# Patient Record
Sex: Female | Born: 1935 | Race: Black or African American | Hispanic: No | Smoking: Never smoker
Health system: Southern US, Community
[De-identification: ages and names within clinical notes are randomized; demographics above are authoritative.]

## PROBLEM LIST (undated history)

## (undated) DIAGNOSIS — D649 Anemia, unspecified: Secondary | ICD-10-CM

## (undated) DIAGNOSIS — E785 Hyperlipidemia, unspecified: Secondary | ICD-10-CM

## (undated) DIAGNOSIS — I10 Essential (primary) hypertension: Secondary | ICD-10-CM

## (undated) DIAGNOSIS — R0600 Dyspnea, unspecified: Secondary | ICD-10-CM

## (undated) DIAGNOSIS — N183 Chronic kidney disease, stage 3 unspecified: Secondary | ICD-10-CM

## (undated) DIAGNOSIS — M109 Gout, unspecified: Secondary | ICD-10-CM

## (undated) DIAGNOSIS — R7303 Prediabetes: Secondary | ICD-10-CM

## (undated) DIAGNOSIS — E119 Type 2 diabetes mellitus without complications: Secondary | ICD-10-CM

## (undated) DIAGNOSIS — I639 Cerebral infarction, unspecified: Secondary | ICD-10-CM

## (undated) DIAGNOSIS — M1990 Unspecified osteoarthritis, unspecified site: Secondary | ICD-10-CM

## (undated) HISTORY — PX: ABDOMINAL HYSTERECTOMY: SHX81

## (undated) HISTORY — DX: Gout, unspecified: M10.9

## (undated) HISTORY — PX: JOINT REPLACEMENT: SHX530

## (undated) HISTORY — DX: Type 2 diabetes mellitus without complications: E11.9

---

## 2005-02-21 ENCOUNTER — Ambulatory Visit: Payer: Self-pay | Admitting: Unknown Physician Specialty

## 2005-04-18 ENCOUNTER — Ambulatory Visit: Payer: Self-pay | Admitting: Gastroenterology

## 2006-07-09 ENCOUNTER — Ambulatory Visit: Payer: Self-pay | Admitting: Unknown Physician Specialty

## 2006-08-02 ENCOUNTER — Emergency Department: Payer: Self-pay

## 2006-08-02 ENCOUNTER — Other Ambulatory Visit: Payer: Self-pay

## 2006-11-28 ENCOUNTER — Emergency Department: Payer: Self-pay | Admitting: Unknown Physician Specialty

## 2006-11-28 ENCOUNTER — Other Ambulatory Visit: Payer: Self-pay

## 2006-12-15 ENCOUNTER — Ambulatory Visit: Payer: Self-pay | Admitting: Unknown Physician Specialty

## 2007-07-20 ENCOUNTER — Ambulatory Visit: Payer: Self-pay | Admitting: Unknown Physician Specialty

## 2008-07-21 ENCOUNTER — Ambulatory Visit: Payer: Self-pay | Admitting: Unknown Physician Specialty

## 2008-09-09 ENCOUNTER — Encounter: Payer: Self-pay | Admitting: Internal Medicine

## 2008-09-19 ENCOUNTER — Encounter: Payer: Self-pay | Admitting: Internal Medicine

## 2009-07-26 ENCOUNTER — Ambulatory Visit: Payer: Self-pay | Admitting: Unknown Physician Specialty

## 2009-09-01 ENCOUNTER — Ambulatory Visit: Payer: Self-pay | Admitting: Gastroenterology

## 2009-10-13 ENCOUNTER — Ambulatory Visit: Payer: Self-pay | Admitting: Unknown Physician Specialty

## 2009-10-28 ENCOUNTER — Emergency Department: Payer: Self-pay | Admitting: Unknown Physician Specialty

## 2010-10-04 ENCOUNTER — Ambulatory Visit: Payer: Self-pay | Admitting: Unknown Physician Specialty

## 2011-07-02 ENCOUNTER — Ambulatory Visit: Payer: Self-pay | Admitting: Unknown Physician Specialty

## 2011-08-22 ENCOUNTER — Ambulatory Visit: Payer: Self-pay | Admitting: Gastroenterology

## 2011-12-17 ENCOUNTER — Ambulatory Visit: Payer: Self-pay | Admitting: Physician Assistant

## 2012-06-28 ENCOUNTER — Emergency Department: Payer: Self-pay | Admitting: Emergency Medicine

## 2012-06-28 LAB — CBC
HCT: 38.7 %
HGB: 13.2 g/dL
MCH: 29.8 pg
MCHC: 34.1 g/dL
MCV: 87 fL
Platelet: 280 x10 3/mm 3
RBC: 4.43 X10 6/mm 3
RDW: 13.1 %
WBC: 7.7 x10 3/mm 3

## 2012-06-28 LAB — BASIC METABOLIC PANEL WITH GFR
Anion Gap: 6 — ABNORMAL LOW
BUN: 14 mg/dL
Calcium, Total: 9 mg/dL
Chloride: 107 mmol/L
Co2: 24 mmol/L
Creatinine: 1.12 mg/dL
EGFR (African American): 55 — ABNORMAL LOW
EGFR (Non-African Amer.): 47 — ABNORMAL LOW
Glucose: 101 mg/dL — ABNORMAL HIGH
Osmolality: 274
Potassium: 4.5 mmol/L
Sodium: 137 mmol/L

## 2012-06-28 LAB — CK TOTAL AND CKMB (NOT AT ARMC)
CK, Total: 171 U/L
CK-MB: 0.9 ng/mL

## 2012-06-28 LAB — TROPONIN I: Troponin-I: <0.02 ng/mL

## 2012-07-28 ENCOUNTER — Ambulatory Visit: Payer: Self-pay | Admitting: Internal Medicine

## 2013-05-06 ENCOUNTER — Ambulatory Visit: Payer: Self-pay | Admitting: Physician Assistant

## 2014-04-14 DIAGNOSIS — I10 Essential (primary) hypertension: Secondary | ICD-10-CM | POA: Insufficient documentation

## 2014-04-14 DIAGNOSIS — D649 Anemia, unspecified: Secondary | ICD-10-CM | POA: Insufficient documentation

## 2014-04-14 DIAGNOSIS — E785 Hyperlipidemia, unspecified: Secondary | ICD-10-CM | POA: Insufficient documentation

## 2014-04-26 ENCOUNTER — Ambulatory Visit: Payer: Self-pay | Admitting: Physician Assistant

## 2014-05-12 ENCOUNTER — Ambulatory Visit: Payer: Self-pay | Admitting: Physician Assistant

## 2015-01-27 DIAGNOSIS — Z96643 Presence of artificial hip joint, bilateral: Secondary | ICD-10-CM | POA: Insufficient documentation

## 2015-01-27 DIAGNOSIS — M5136 Other intervertebral disc degeneration, lumbar region: Secondary | ICD-10-CM | POA: Insufficient documentation

## 2015-01-31 DIAGNOSIS — Z86010 Personal history of colonic polyps: Secondary | ICD-10-CM | POA: Insufficient documentation

## 2015-04-12 ENCOUNTER — Other Ambulatory Visit: Payer: Self-pay | Admitting: Physician Assistant

## 2015-04-12 DIAGNOSIS — Z1231 Encounter for screening mammogram for malignant neoplasm of breast: Secondary | ICD-10-CM

## 2015-05-01 ENCOUNTER — Encounter: Payer: Self-pay | Admitting: *Deleted

## 2015-05-02 ENCOUNTER — Ambulatory Visit
Admission: RE | Admit: 2015-05-02 | Discharge: 2015-05-02 | Disposition: A | Discharge status: Home / Self Care | Payer: Medicare Other | Source: Ambulatory Visit | Attending: Gastroenterology | Admitting: Gastroenterology

## 2015-05-02 ENCOUNTER — Encounter
Admission: RE | Disposition: A | Discharge status: Home / Self Care | Payer: Self-pay | Source: Ambulatory Visit | Attending: Gastroenterology

## 2015-05-02 ENCOUNTER — Encounter: Payer: Self-pay | Admitting: *Deleted

## 2015-05-02 ENCOUNTER — Ambulatory Visit: Payer: Medicare Other | Admitting: Anesthesiology

## 2015-05-02 DIAGNOSIS — E785 Hyperlipidemia, unspecified: Secondary | ICD-10-CM | POA: Diagnosis not present

## 2015-05-02 DIAGNOSIS — D120 Benign neoplasm of cecum: Secondary | ICD-10-CM | POA: Insufficient documentation

## 2015-05-02 DIAGNOSIS — M1990 Unspecified osteoarthritis, unspecified site: Secondary | ICD-10-CM | POA: Insufficient documentation

## 2015-05-02 DIAGNOSIS — Z79899 Other long term (current) drug therapy: Secondary | ICD-10-CM | POA: Diagnosis not present

## 2015-05-02 DIAGNOSIS — I10 Essential (primary) hypertension: Secondary | ICD-10-CM | POA: Insufficient documentation

## 2015-05-02 DIAGNOSIS — D649 Anemia, unspecified: Secondary | ICD-10-CM | POA: Diagnosis not present

## 2015-05-02 DIAGNOSIS — Z7982 Long term (current) use of aspirin: Secondary | ICD-10-CM | POA: Insufficient documentation

## 2015-05-02 DIAGNOSIS — Z86010 Personal history of colonic polyps: Secondary | ICD-10-CM | POA: Diagnosis present

## 2015-05-02 DIAGNOSIS — D125 Benign neoplasm of sigmoid colon: Secondary | ICD-10-CM | POA: Diagnosis not present

## 2015-05-02 DIAGNOSIS — K5730 Diverticulosis of large intestine without perforation or abscess without bleeding: Secondary | ICD-10-CM | POA: Diagnosis not present

## 2015-05-02 HISTORY — DX: Anemia, unspecified: D64.9

## 2015-05-02 HISTORY — DX: Essential (primary) hypertension: I10

## 2015-05-02 HISTORY — DX: Unspecified osteoarthritis, unspecified site: M19.90

## 2015-05-02 HISTORY — PX: COLONOSCOPY WITH PROPOFOL: SHX5780

## 2015-05-02 HISTORY — DX: Hyperlipidemia, unspecified: E78.5

## 2015-05-02 SURGERY — COLONOSCOPY WITH PROPOFOL
Anesthesia: General

## 2015-05-02 MED ORDER — MIDAZOLAM HCL 5 MG/5ML IJ SOLN
INTRAMUSCULAR | Status: DC | PRN
  Administered 2015-05-02: 1 mg via INTRAVENOUS

## 2015-05-02 MED ORDER — LIDOCAINE HCL (CARDIAC) 20 MG/ML IV SOLN
INTRAVENOUS | Status: DC | PRN
  Administered 2015-05-02: 20 mg via INTRAVENOUS

## 2015-05-02 MED ORDER — PROPOFOL 10 MG/ML IV BOLUS
INTRAVENOUS | Status: DC | PRN
  Administered 2015-05-02: 50 mg via INTRAVENOUS

## 2015-05-02 MED ORDER — SODIUM CHLORIDE 0.9 % IV SOLN: INTRAVENOUS | Status: DC

## 2015-05-02 MED ORDER — FENTANYL CITRATE (PF) 100 MCG/2ML IJ SOLN
INTRAMUSCULAR | Status: DC | PRN
Start: 2015-05-02 — End: 2015-05-02
  Administered 2015-05-02: 50 ug via INTRAVENOUS

## 2015-05-02 MED ORDER — EPHEDRINE SULFATE 50 MG/ML IJ SOLN
INTRAMUSCULAR | Status: DC | PRN
  Administered 2015-05-02: 10 mg via INTRAVENOUS

## 2015-05-02 MED ORDER — PROPOFOL 500 MG/50ML IV EMUL
INTRAVENOUS | Status: DC | PRN
  Administered 2015-05-02: 100 ug/kg/min via INTRAVENOUS

## 2015-05-02 MED ORDER — SODIUM CHLORIDE 0.9 % IV SOLN
INTRAVENOUS | Status: DC
  Administered 2015-05-02 (×2): via INTRAVENOUS

## 2015-05-02 NOTE — Op Note (Signed)
[facility] Gastroenterology Patient Name: Betty Atkins Procedure Date: 05/02/2015 8:13 AM MRN: [id number] Account #: [id number] Date of Birth: 1933-01-03 Admit Type: Outpatient Age: 81 Room: [facility] ENDO ROOM 3 Gender: Female Note Status: Finalized Procedure:         Colonoscopy Indications:       Personal history of colonic polyps Providers:         Lollie Sails, MD Referring MD:      Precious Bard, MD (Referring MD) Medicines:         Monitored Anesthesia Care Complications:     No immediate complications. Procedure:         Pre-Anesthesia Assessment:                    - ASA Grade Assessment: III - A patient with severe                     systemic disease.                    After obtaining informed consent, the colonoscope was                     passed under direct vision. Throughout the procedure, the                     patient's blood pressure, pulse, and oxygen saturations                     were monitored continuously. The Colonoscope was                     introduced through the anus and advanced to the the cecum,                     identified by appendiceal orifice and ileocecal valve. The                     colonoscopy was performed without difficulty. The patient                     tolerated the procedure well. The quality of the bowel                     preparation was good. Findings:      A 2 mm polyp was found at the appendiceal orifice. The polyp was       sessile. The polyp was removed with a cold biopsy forceps. Resection and       retrieval were complete.      A 3 mm polyp was found in the proximal sigmoid colon. The polyp was       flat. The polyp was removed with a cold snare. Resection and retrieval       were complete.      A few small-mouthed diverticula were found in the sigmoid colon.      The retroflexed view of the distal rectum and anal verge was normal and       showed no anal or rectal abnormalities.  The exam was otherwise without abnormality.      The digital rectal exam was normal. Impression:        - One 2 mm polyp at the appendiceal orifice. Resected and  retrieved.                    - One 3 mm polyp in the proximal sigmoid colon. Resected                     and retrieved.                    - Diverticulosis in the sigmoid colon.                    - The distal rectum and anal verge are normal on                     retroflexion view.                    - The examination was otherwise normal. Recommendation:    - Await pathology results.                    - Telephone GI clinic for pathology results in 1 week. Procedure Code(s): --- Professional ---                    [phone], Colonoscopy, flexible; with removal of tumor(s),                     polyp(s), or other lesion(s) by snare technique                    45380, 61, Colonoscopy, flexible; with biopsy, single or                     multiple Diagnosis Code(s): --- Professional ---                    211.3, Benign neoplasm of colon                    V12.72, Personal history of colonic polyps                    562.10, Diverticulosis of colon (without mention of                     hemorrhage) CPT copyright 2014 American Medical Association. All rights reserved. The codes documented in this report are preliminary and upon coder review may  be revised to meet current compliance requirements. Lollie Sails, MD 05/02/2015 8:42:48 AM This report has been signed electronically. Number of Addenda: 0 Note Initiated On: 05/02/2015 8:13 AM Scope Withdrawal Time: 0 hours 11 minutes 23 seconds  Total Procedure Duration: 0 hours 19 minutes 21 seconds       [facility]

## 2015-05-02 NOTE — Anesthesia Postprocedure Evaluation (Signed)
  Anesthesia Post-op Note  Patient: Betty Atkins  Procedure(s) Performed: Procedure(s): COLONOSCOPY WITH PROPOFOL (N/A)  Anesthesia type:General  Patient location: PACU  Post pain: Pain level controlled  Post assessment: Post-op Vital signs reviewed, Patient's Cardiovascular Status Stable, Respiratory Function Stable, Patent Airway and No signs of Nausea or vomiting  Post vital signs: Reviewed and stable  Last Vitals:  Filed Vitals:   05/02/15 0852  BP: 107/59  Pulse: 90  Temp: 36 C  Resp: 19    Level of consciousness: awake, alert  and patient cooperative  Complications: No apparent anesthesia complications

## 2015-05-02 NOTE — Anesthesia Preprocedure Evaluation (Signed)
Anesthesia Evaluation  Patient identified by MRN, date of birth, ID band Patient awake    Reviewed: Allergy & Precautions, H&P , NPO status , Patient's Chart, lab work & pertinent test results  History of Anesthesia Complications Negative for: history of anesthetic complications  Airway Mallampati: III  TM Distance: >3 FB Neck ROM: limited    Dental no notable dental hx. (+) Teeth Intact   Pulmonary neg pulmonary ROS, neg shortness of breath,    Pulmonary exam normal breath sounds clear to auscultation       Cardiovascular Exercise Tolerance: Good hypertension, (-) Past MI Normal cardiovascular exam Rhythm:regular Rate:Normal     Neuro/Psych negative neurological ROS  negative psych ROS   GI/Hepatic negative GI ROS, Neg liver ROS, neg GERD  ,  Endo/Other  negative endocrine ROS  Renal/GU negative Renal ROS  negative genitourinary   Musculoskeletal  (+) Arthritis ,   Abdominal   Peds  Hematology negative hematology ROS (+) anemia ,   Anesthesia Other Findings Past Medical History:   Hyperlipidemia                                               Hypertension                                                 Arthritis                                                      Comment:degenerative   Anemia                                                      BMI    Body Mass Index   36.01 kg/m 2    Signs and symptoms suggestive of sleep apnea    Reproductive/Obstetrics negative OB ROS                             Anesthesia Physical Anesthesia Plan  ASA: III  Anesthesia Plan: General   Post-op Pain Management:    Induction:   Airway Management Planned:   Additional Equipment:   Intra-op Plan:   Post-operative Plan:   Informed Consent: I have reviewed the patients History and Physical, chart, labs and discussed the procedure including the risks, benefits and alternatives for the  proposed anesthesia with the patient or authorized representative who has indicated his/her understanding and acceptance.   Dental Advisory Given  Plan Discussed with: Anesthesiologist, CRNA and Surgeon  Anesthesia Plan Comments:         Anesthesia Quick Evaluation

## 2015-05-02 NOTE — H&P (Signed)
Outpatient short stay form Pre-procedure 05/02/2015 8:05 AM Lollie Sails MD  Primary Physician: Jackelyn Poling NP  Reason for visit:  Colonoscopy  History of present illness:  Patient is a 81 year old female with a personal history of adenomatous colon polyps. She is presenting today for follow-up colonoscopy. She tolerated her prep well. She takes no anticoagulation medications or aspirin products.    Current facility-administered medications:  .  0.9 %  sodium chloride infusion, , Intravenous, Continuous, Lollie Sails, MD, Last Rate: 20 mL/hr at 05/02/15 0729 .  0.9 %  sodium chloride infusion, , Intravenous, Continuous, Lollie Sails, MD  Prescriptions prior to admission  Medication Sig Dispense Refill Last Dose  . amLODipine-benazepril (LOTREL) 5-20 MG capsule Take 1 capsule by mouth daily.     Marland Kitchen aspirin 81 MG tablet Take 81 mg by mouth daily.   Past Week at Unknown time  . atorvastatin (LIPITOR) 20 MG tablet Take 20 mg by mouth daily.     . Calcium Carbonate-Vit D-Min (CALTRATE PLUS PO) Take by mouth.     . cholecalciferol (VITAMIN D) 1000 UNITS tablet Take 1,000 Units by mouth daily.     . hydrochlorothiazide (HYDRODIURIL) 25 MG tablet Take 25 mg by mouth daily.     . Multiple Vitamin (MULTIVITAMIN) tablet Take 1 tablet by mouth daily.     . timolol (TIMOPTIC) 0.5 % ophthalmic solution 1 drop 2 (two) times daily.        No Known Allergies   Past Medical History  Diagnosis Date  . Hyperlipidemia   . Hypertension   . Arthritis     degenerative  . Anemia     Review of systems:      Physical Exam    Heart and lungs: Regular rate and rhythm without rub or gallop, lungs are bilaterally clear    HEENT: Norm cephalic atraumatic eyes are anicteric    Other:     Pertinant exam for procedure: Soft nontender nondistended bowel sounds positive normoactive    Planned proceedures: Colonoscopy and indicated procedures I have discussed the risks benefits  and complications of procedures to include not limited to bleeding, infection, perforation and the risk of sedation and the patient wishes to proceed.    Lollie Sails, MD Gastroenterology 05/02/2015  8:05 AM

## 2015-05-02 NOTE — Transfer of Care (Signed)
Immediate Anesthesia Transfer of Care Note  Patient: Emalea Mix  Procedure(s) Performed: Procedure(s): COLONOSCOPY WITH PROPOFOL (N/A)  Patient Location: PACU and Short Stay  Anesthesia Type:General  Level of Consciousness: awake and oriented  Airway & Oxygen Therapy: Patient Spontanous Breathing and Patient connected to nasal cannula oxygen  Post-op Assessment: Report given to RN and Post -op Vital signs reviewed and stable  Post vital signs: Reviewed and stable  Last Vitals:  Filed Vitals:   05/02/15 0715  BP: 166/79  Pulse: 98  Temp: 36.4 C  Resp: 18    Complications: No apparent anesthesia complications

## 2015-05-03 LAB — SURGICAL PATHOLOGY

## 2015-05-10 ENCOUNTER — Encounter: Payer: Self-pay | Admitting: Gastroenterology

## 2015-05-15 ENCOUNTER — Ambulatory Visit
Admission: RE | Admit: 2015-05-15 | Discharge: 2015-05-15 | Disposition: A | Discharge status: Home / Self Care | Payer: Medicare Other | Source: Ambulatory Visit | Attending: Physician Assistant | Admitting: Physician Assistant

## 2015-05-15 ENCOUNTER — Other Ambulatory Visit: Payer: Self-pay | Admitting: Physician Assistant

## 2015-05-15 DIAGNOSIS — Z1231 Encounter for screening mammogram for malignant neoplasm of breast: Secondary | ICD-10-CM

## 2015-10-13 ENCOUNTER — Other Ambulatory Visit: Payer: Self-pay | Admitting: Physician Assistant

## 2015-10-13 DIAGNOSIS — R1032 Left lower quadrant pain: Secondary | ICD-10-CM

## 2015-10-31 ENCOUNTER — Ambulatory Visit: Payer: Medicare Other

## 2015-11-30 ENCOUNTER — Ambulatory Visit: Payer: Medicare Other

## 2016-01-04 ENCOUNTER — Ambulatory Visit
Admission: RE | Admit: 2016-01-04 | Discharge: 2016-01-04 | Disposition: A | Discharge status: Home / Self Care | Payer: Medicare Other | Source: Ambulatory Visit | Attending: Physician Assistant | Admitting: Physician Assistant

## 2016-01-04 DIAGNOSIS — R1032 Left lower quadrant pain: Secondary | ICD-10-CM | POA: Diagnosis present

## 2016-01-04 DIAGNOSIS — M5136 Other intervertebral disc degeneration, lumbar region: Secondary | ICD-10-CM | POA: Insufficient documentation

## 2016-01-04 LAB — POCT I-STAT CREATININE: Creatinine, Ser: 1.2 mg/dL — ABNORMAL HIGH (ref 0.44–1.00)

## 2016-01-04 MED ORDER — IOPAMIDOL (ISOVUE-300) INJECTION 61%
100.0000 mL | Freq: Once | INTRAVENOUS | Status: AC | PRN
  Administered 2016-01-04: 100 mL via INTRAVENOUS

## 2016-05-23 ENCOUNTER — Other Ambulatory Visit: Payer: Self-pay | Admitting: Physician Assistant

## 2016-05-23 DIAGNOSIS — Z1231 Encounter for screening mammogram for malignant neoplasm of breast: Secondary | ICD-10-CM

## 2016-06-26 ENCOUNTER — Ambulatory Visit
Admission: RE | Admit: 2016-06-26 | Discharge: 2016-06-26 | Disposition: A | Discharge status: Home / Self Care | Payer: Medicare Other | Source: Ambulatory Visit | Attending: Physician Assistant | Admitting: Physician Assistant

## 2016-06-26 DIAGNOSIS — Z1231 Encounter for screening mammogram for malignant neoplasm of breast: Secondary | ICD-10-CM | POA: Diagnosis not present

## 2016-11-21 DIAGNOSIS — R7303 Prediabetes: Secondary | ICD-10-CM | POA: Insufficient documentation

## 2017-06-06 ENCOUNTER — Other Ambulatory Visit: Payer: Self-pay | Admitting: Physician Assistant

## 2017-06-06 DIAGNOSIS — Z1231 Encounter for screening mammogram for malignant neoplasm of breast: Secondary | ICD-10-CM

## 2017-06-10 ENCOUNTER — Encounter: Payer: Self-pay | Admitting: *Deleted

## 2017-06-10 ENCOUNTER — Encounter: Payer: Medicare Other | Attending: Physician Assistant | Admitting: *Deleted

## 2017-06-10 VITALS — BP 122/70 | Ht 67.0 in | Wt 239.1 lb

## 2017-06-10 DIAGNOSIS — Z713 Dietary counseling and surveillance: Secondary | ICD-10-CM | POA: Insufficient documentation

## 2017-06-10 DIAGNOSIS — E119 Type 2 diabetes mellitus without complications: Secondary | ICD-10-CM

## 2017-06-10 NOTE — Patient Instructions (Signed)
Exercise:    Continue walking  for    10  minutes   3  days a week   and gradually increase to 150 minutes/week  Eat 3 meals day,  2-3  snacks a day Space meals 4-6 hours apart Limit foods high in fat Avoid sugar sweetened drinks (soda, tea, juices)  Return for classes on:

## 2017-06-10 NOTE — Progress Notes (Signed)
Diabetes Self-Management Education  Visit Type: First/Initial  Appt. Start Time: 1035 Appt. End Time: 4010  06/10/2017  Ms. Betty Atkins, identified by name and date of birth, is a 83 y.o. female with a diagnosis of Diabetes: Type 2.   ASSESSMENT  Blood pressure 122/70, height 5\' 7"  (1.702 m), weight 239 lb 1.6 oz (108.5 kg). Body mass index is 37.45 kg/m.  Diabetes Self-Management Education - 06/10/17 1337      Visit Information   Visit Type  First/Initial      Initial Visit   Diabetes Type  Type 2    Are you currently following a meal plan?  No    Are you taking your medications as prescribed?  Yes    Date Diagnosed  November 2018      Health Coping   How would you rate your overall health?  Good      Psychosocial Assessment   Patient Belief/Attitude about Diabetes  Other (comment) "not sure"    Self-care barriers  None    Self-management support  Doctor's office;Family    Patient Concerns  Nutrition/Meal planning;Medication;Monitoring;Weight Control;Glycemic Control;Healthy Lifestyle    Special Needs  None    Preferred Learning Style  Auditory;Visual;Hands on    Learning Readiness  Ready    How often do you need to have someone help you when you read instructions, pamphlets, or other written materials from your doctor or pharmacy?  1 - Never    What is the last grade level you completed in school?  MS      Pre-Education Assessment   Patient understands the diabetes disease and treatment process.  Needs Instruction    Patient understands incorporating nutritional management into lifestyle.  Needs Instruction    Patient undertands incorporating physical activity into lifestyle.  Needs Instruction    Patient understands using medications safely.  Needs Instruction    Patient understands monitoring blood glucose, interpreting and using results  Needs Instruction    Patient understands prevention, detection, and treatment of acute complications.  Needs Instruction    Patient understands prevention, detection, and treatment of chronic complications.  Needs Instruction    Patient understands how to develop strategies to address psychosocial issues.  Needs Instruction    Patient understands how to develop strategies to promote health/change behavior.  Needs Instruction      Complications   Last HgB A1C per patient/outside source  6.6 % 05/26/17    How often do you check your blood sugar?  Patient declines She doesn't want to check blood sugars at home at this time. BG in the office was 117 mg/dL at 11:35 am - fasting.     Have you had a dilated eye exam in the past 12 months?  Yes    Have you had a dental exam in the past 12 months?  No    Are you checking your feet?  Yes    How many days per week are you checking your feet?  7      Dietary Intake   Breakfast  sausage and gravy; Glucerna; left over chicken    Snack (morning)  carrots, celery, cream cheese dip    Lunch  chicken, fish, crab cakes, shrimp, salmon, stuffing, sandwiches    Snack (afternoon)  spinach salad, crotons, soup    Dinner  fish or chicken with potatoes, beans, green beans, greens, salads, spinach    Snack (evening)  same as morning or afternoon snacks    Beverage(s)  water, sweet tea,  juice, soda, Crystal Light      Exercise   Exercise Type  Light (walking / raking leaves)    How many days per week to you exercise?  2    How many minutes per day do you exercise?  10    Total minutes per week of exercise  20      Patient Education   Previous Diabetes Education  No    Disease state   Definition of diabetes, type 1 and 2, and the diagnosis of diabetes    Nutrition management   Role of diet in the treatment of diabetes and the relationship between the three main macronutrients and blood glucose level    Physical activity and exercise   Role of exercise on diabetes management, blood pressure control and cardiac health.    Monitoring  Identified appropriate SMBG and/or A1C goals.     Chronic complications  Relationship between chronic complications and blood glucose control    Psychosocial adjustment  Identified and addressed patients feelings and concerns about diabetes      Individualized Goals (developed by patient)   Reducing Risk  Improve blood sugars Decrease medications Prevent diabetes complications Lose weight Lead a healthier lifestyle     Outcomes   Expected Outcomes  Demonstrated interest in learning. Expect positive outcomes       Individualized Plan for Diabetes Self-Management Training:   Learning Objective:  Patient will have a greater understanding of diabetes self-management. Patient education plan is to attend individual and/or group sessions per assessed needs and concerns.   Plan:   Patient Instructions  Exercise:    Continue walking  for    10  minutes   3  days a week   and gradually increase to 150 minutes/week Eat 3 meals day,  2-3  snacks a day Space meals 4-6 hours apart Limit foods high in fat Avoid sugar sweetened drinks (soda, tea, juices)  Expected Outcomes:  Demonstrated interest in learning. Expect positive outcomes  Education material provided:  General Meal Planning Guidelines Simple Meal Plan  If problems or questions, patient to contact team via:  Johny Drilling, [place], [place], CDE [phone]  Future DSME appointment:  June 19, 2017 for Diabetes Class 1

## 2017-06-19 ENCOUNTER — Encounter: Payer: Self-pay | Admitting: Dietician

## 2017-06-19 ENCOUNTER — Encounter: Payer: Medicare Other | Admitting: Dietician

## 2017-06-19 VITALS — Ht 67.0 in | Wt 238.4 lb

## 2017-06-19 DIAGNOSIS — E119 Type 2 diabetes mellitus without complications: Secondary | ICD-10-CM

## 2017-06-19 DIAGNOSIS — Z713 Dietary counseling and surveillance: Secondary | ICD-10-CM | POA: Diagnosis not present

## 2017-06-19 NOTE — Progress Notes (Signed)

## 2017-06-26 ENCOUNTER — Encounter: Payer: Self-pay | Admitting: *Deleted

## 2017-06-26 ENCOUNTER — Encounter: Payer: Medicare Other | Attending: Physician Assistant | Admitting: *Deleted

## 2017-06-26 VITALS — Wt 239.8 lb

## 2017-06-26 DIAGNOSIS — Z713 Dietary counseling and surveillance: Secondary | ICD-10-CM | POA: Insufficient documentation

## 2017-06-26 DIAGNOSIS — E119 Type 2 diabetes mellitus without complications: Secondary | ICD-10-CM | POA: Diagnosis not present

## 2017-06-26 NOTE — Progress Notes (Signed)

## 2017-06-27 ENCOUNTER — Ambulatory Visit
Admission: RE | Admit: 2017-06-27 | Discharge: 2017-06-27 | Disposition: A | Discharge status: Home / Self Care | Payer: Medicare Other | Source: Ambulatory Visit | Attending: Physician Assistant | Admitting: Physician Assistant

## 2017-06-27 DIAGNOSIS — Z1231 Encounter for screening mammogram for malignant neoplasm of breast: Secondary | ICD-10-CM | POA: Diagnosis not present

## 2017-07-03 ENCOUNTER — Encounter: Payer: Self-pay | Admitting: Dietician

## 2017-07-03 ENCOUNTER — Encounter: Payer: Medicare Other | Admitting: Dietician

## 2017-07-03 VITALS — BP 136/90 | Ht 67.0 in | Wt 239.3 lb

## 2017-07-03 DIAGNOSIS — E119 Type 2 diabetes mellitus without complications: Secondary | ICD-10-CM

## 2017-07-03 DIAGNOSIS — Z713 Dietary counseling and surveillance: Secondary | ICD-10-CM | POA: Diagnosis not present

## 2017-07-03 NOTE — Progress Notes (Signed)

## 2017-07-04 ENCOUNTER — Encounter: Payer: Self-pay | Admitting: *Deleted

## 2017-11-17 DIAGNOSIS — E782 Mixed hyperlipidemia: Secondary | ICD-10-CM | POA: Insufficient documentation

## 2017-11-17 DIAGNOSIS — E119 Type 2 diabetes mellitus without complications: Secondary | ICD-10-CM | POA: Insufficient documentation

## 2018-01-30 ENCOUNTER — Encounter: Payer: Self-pay | Admitting: *Deleted

## 2018-03-19 ENCOUNTER — Encounter: Payer: Self-pay | Admitting: Obstetrics and Gynecology

## 2018-03-19 ENCOUNTER — Ambulatory Visit (INDEPENDENT_AMBULATORY_CARE_PROVIDER_SITE_OTHER): Payer: Medicare Other | Admitting: Obstetrics and Gynecology

## 2018-03-19 VITALS — BP 130/78 | HR 78 | Ht 68.0 in | Wt 238.0 lb

## 2018-03-19 DIAGNOSIS — N906 Unspecified hypertrophy of vulva: Secondary | ICD-10-CM

## 2018-03-19 NOTE — Progress Notes (Signed)
Patient ID: Betty Atkins, female   DOB: [birth year], 84 y.o.   MRN: [id number]  Reason for Consult: Consult (4 or 5 years ago had labia mole removed and was told to come back if it returned )   Referred by Marinda Elk, MD  Subjective:     HPI:  Betty Atkins is a 84 y.o. female .  She is an 84 year old patient who presents today with complaints of a labial growth.  She reports that she noticed a similar growth about 5 years ago.  She had this growth removed removed by a different OB/GYN.  She reports that it was a normal mole.  She was told at the time that it may recur.  She feels like it has reoccurred as she can feel a bump of tissue on her labia.  She has not had symptoms of bleeding or spotting.  She reports that this growth has been there for about 6 months.  She is not currently sexually active.  She reports that sometimes she has pain if the labia is caught on underwear or it it rubs clothing.   Past Medical History:  Diagnosis Date  . Anemia   . Arthritis    degenerative  . Diabetes mellitus without complication ([place])   . Gout involving toe   . Hyperlipidemia   . Hypertension    Family History  Problem Relation Age of Onset  . Breast cancer Sister 102  . Diabetes Sister    Past Surgical History:  Procedure Laterality Date  . ABDOMINAL HYSTERECTOMY    . COLONOSCOPY WITH PROPOFOL N/A 05/02/2015   Procedure: COLONOSCOPY WITH PROPOFOL;  Surgeon: Lollie Sails, MD;  Location: [facility] ENDOSCOPY;  Service: Endoscopy;  Laterality: N/A;  . JOINT REPLACEMENT     right total hip    Short Social History:  Social History   Tobacco Use  . Smoking status: Never Smoker  . Smokeless tobacco: Never Used  Substance Use Topics  . Alcohol use: No    Allergies  Allergen Reactions  . Iodinated Diagnostic Agents Hives and Itching    I gave the patient 144ml of isovue 300. As i was getting her up after the scan she was itching the Left side of her face. She had  developed two hives on the left side of her face. Dr. Clovis Riley came and evaluated her. We gave her 50mg  of Benadryl and her symptoms improved. SPM I gave the patient 112ml of isovue 300. As i was getting her up after the scan she was itching the Left side of her face. She had developed two hives on the left side of her face. Dr. Clovis Riley came and evaluated her. We gave her 50mg  of Benadryl and her symptoms improved. SPM I gave the patient 1108ml of isovue 300. As i was getting her up after the scan she was itching the Left side of her face. She had developed two hives on the left side of her face. Dr. Clovis Riley came and evaluated her. We gave her 50mg  of Benadryl and her symptoms improved. SPM  I gave the patient 162ml of isovue 300. As i was getting her up after the scan she was itching the Left side of her face. She had developed two hives on the left side of her face. Dr. Clovis Riley came and evaluated her. We gave her 50mg  of Benadryl and her symptoms improved. SPM I gave the patient 156ml of isovue 300. As i was getting her up after the scan she  was itching the Left side of her face. She had developed two hives on the left side of her face. Dr. Clovis Riley came and evaluated her. We gave her 50mg  of Benadryl and her symptoms improved. SPM I gave the patient 175ml of isovue 300. As i was getting her up after the scan she was itching the Left side of her face. She had developed two hives on the left side of her face. Dr. Clovis Riley came and evaluated her. We gave her 50mg  of Benadryl and her symptoms improved. SPM   . Metrizamide Hives and Itching    I gave the patient 162ml of isovue 300. As i was getting her up after the scan she was itching the Left side of her face. She had developed two hives on the left side of her face. Dr. Clovis Riley came and evaluated her. We gave her 50mg  of Benadryl and her symptoms improved. SPM    Current Outpatient Medications  Medication Sig Dispense Refill  . allopurinol (ZYLOPRIM) 100 MG  tablet Take 100 mg by mouth daily.    Marland Kitchen amLODipine-benazepril (LOTREL) 5-20 MG capsule Take 1 capsule by mouth daily.    Marland Kitchen aspirin EC 81 MG tablet Take 162 mg by mouth daily.    Marland Kitchen atorvastatin (LIPITOR) 20 MG tablet Take 20 mg by mouth daily.    . Calcium Carbonate-Vit D-Min (CALTRATE PLUS PO) Take 1 tablet by mouth daily.     . cholecalciferol (VITAMIN D) 1000 UNITS tablet Take 1,000 Units by mouth daily.    . COMBIGAN 0.2-0.5 % ophthalmic solution INT 1 GTT IN OU BID  5  . Cyanocobalamin (VITAMIN B-12) 5000 MCG SUBL Place 5,000 mcg under the tongue daily.    . hydrochlorothiazide (HYDRODIURIL) 25 MG tablet Take 25 mg by mouth daily.    . Multiple Vitamin (MULTIVITAMIN) tablet Take 1 tablet by mouth daily.     No current facility-administered medications for this visit.     Review of Systems  Constitutional: Negative for chills, fatigue, fever and unexpected weight change.  HENT: Negative for trouble swallowing.  Eyes: Negative for loss of vision.  Respiratory: Negative for cough, shortness of breath and wheezing.  Cardiovascular: Negative for chest pain, leg swelling, palpitations and syncope.  GI: Negative for abdominal pain, blood in stool, diarrhea, nausea and vomiting.  GU: Negative for difficulty urinating, dysuria, frequency and hematuria.  Musculoskeletal: Negative for back pain, leg pain and joint pain.  Skin: Negative for rash.  Neurological: Negative for dizziness, headaches, light-headedness, numbness and seizures.  Psychiatric: Negative for behavioral problem, confusion, depressed mood and sleep disturbance.       Objective:  Objective   Vitals:   03/19/18 1125  BP: 130/78  Pulse: 78  Weight: 238 lb (108 kg)  Height: 5\' 8"  (1.727 m)   Body mass index is 36.19 kg/m.  Physical Exam  Constitutional: She is oriented to person, place, and time. She appears well-developed and well-nourished.  HENT:  Head: Normocephalic and atraumatic.  Eyes: Pupils are equal,  round, and reactive to light. EOM are normal.  Cardiovascular: Normal rate and regular rhythm.  Pulmonary/Chest: Effort normal. No respiratory distress.  Abdominal: Soft.  Genitourinary:  Genitourinary Comments: Normal right labia.  Left labial also normal but elongated. She denies any past trauma. The tissue is soft, no redness or erythema, normal in appearance, but elongated. No skin changes.    Neurological: She is alert and oriented to person, place, and time.  Skin: Skin is warm and dry.  Psychiatric: She has a normal mood and affect. Her behavior is normal. Judgment and thought content normal.  Nursing note and vitals reviewed.        Assessment/Plan:     84 yo with normal elongated left labia. No concerning mass, growth or mole. 1. Discussed that if desired a labiaplasty could be performed but this would have to happen in the OR. She does not feel that this is a problem at this time and would like to avoid surgery. She will apply Vaseline to the labia to help with pain and protect the skin.   More than 20 minutes were spent face to face with the patient in the room with more than 50% of the time spent providing counseling and discussing the plan of management. We discussed the appearance of her labia and a plan of mnagement.     Adrian Prows MD [facility] 03/19/18 1:39 PM

## 2018-06-02 ENCOUNTER — Other Ambulatory Visit: Payer: Self-pay | Admitting: Physician Assistant

## 2018-06-02 DIAGNOSIS — Z1231 Encounter for screening mammogram for malignant neoplasm of breast: Secondary | ICD-10-CM

## 2019-09-20 ENCOUNTER — Other Ambulatory Visit: Payer: Self-pay | Admitting: Physician Assistant

## 2019-09-20 DIAGNOSIS — Z1231 Encounter for screening mammogram for malignant neoplasm of breast: Secondary | ICD-10-CM

## 2019-11-15 ENCOUNTER — Ambulatory Visit
Admission: RE | Admit: 2019-11-15 | Discharge: 2019-11-15 | Disposition: A | Discharge status: Home / Self Care | Payer: Medicare Other | Source: Ambulatory Visit | Attending: Physician Assistant | Admitting: Physician Assistant

## 2019-11-15 DIAGNOSIS — Z1231 Encounter for screening mammogram for malignant neoplasm of breast: Secondary | ICD-10-CM

## 2019-11-16 ENCOUNTER — Encounter: Payer: Self-pay | Admitting: Obstetrics and Gynecology

## 2019-11-16 ENCOUNTER — Ambulatory Visit (INDEPENDENT_AMBULATORY_CARE_PROVIDER_SITE_OTHER): Payer: Medicare Other | Admitting: Obstetrics and Gynecology

## 2019-11-16 ENCOUNTER — Other Ambulatory Visit: Payer: Self-pay

## 2019-11-16 VITALS — BP 162/98 | Ht 67.0 in | Wt 250.0 lb

## 2019-11-16 DIAGNOSIS — N904 Leukoplakia of vulva: Secondary | ICD-10-CM | POA: Diagnosis not present

## 2019-11-16 DIAGNOSIS — N906 Unspecified hypertrophy of vulva: Secondary | ICD-10-CM

## 2019-11-16 MED ORDER — CLOBETASOL PROPIONATE 0.05 % EX OINT: TOPICAL_OINTMENT | CUTANEOUS | 5 refills | Status: AC

## 2019-11-16 NOTE — Progress Notes (Signed)
Patient ID: Betty Atkins, female   DOB: 1933-05-30, 86 y.o.   MRN: [id number]  Reason for Consult: Gynecologic Exam   Referred by Marinda Elk, MD  Subjective:     HPI:  Betty Atkins is a 86 y.o. female.  She presents today with complaints of a labial growth.  This is similar to her complaint from two years ago. She reports that she noticed a similar growth about 7 years ago.  She had this growth removed removed by a different OB/GYN.  She reports that it was a normal mole.  She was told at the time that it may recur.  She feels like it has reoccurred as she can feel a bump of tissue on her labia.  She has not had symptoms of bleeding or spotting.  She reports that this growth has been there for at least 2 years.  She is not currently sexually active.  She reports that sometimes she has pain if the labia is caught on underwear or it it rubs clothing. I saw her before and noted a unilateral elongated labia. We discussed a labiaplasty, but she was not interested at that time.  She additionally nots vulvar and perineal itching. She reports she also has full body itching and is being treated for this by her dermatologist.   Past Medical History:  Diagnosis Date  . Anemia   . Arthritis    degenerative  . Diabetes mellitus without complication ([place])   . Gout involving toe   . Hyperlipidemia   . Hypertension    Family History  Problem Relation Age of Onset  . Breast cancer Sister 7  . Diabetes Sister    Past Surgical History:  Procedure Laterality Date  . ABDOMINAL HYSTERECTOMY    . COLONOSCOPY WITH PROPOFOL N/A 05/02/2015   Procedure: COLONOSCOPY WITH PROPOFOL;  Surgeon: Lollie Sails, MD;  Location: [facility] ENDOSCOPY;  Service: Endoscopy;  Laterality: N/A;  . JOINT REPLACEMENT     right total hip    Short Social History:  Social History   Tobacco Use  . Smoking status: Never Smoker  . Smokeless tobacco: Never Used  Substance Use Topics  . Alcohol use: No     Allergies  Allergen Reactions  . Iodinated Diagnostic Agents Hives and Itching    I gave the patient 17ml of isovue 300. As i was getting her up after the scan she was itching the Left side of her face. She had developed two hives on the left side of her face. Dr. Clovis Riley came and evaluated her. We gave her 50mg  of Benadryl and her symptoms improved. SPM I gave the patient 153ml of isovue 300. As i was getting her up after the scan she was itching the Left side of her face. She had developed two hives on the left side of her face. Dr. Clovis Riley came and evaluated her. We gave her 50mg  of Benadryl and her symptoms improved. SPM I gave the patient 169ml of isovue 300. As i was getting her up after the scan she was itching the Left side of her face. She had developed two hives on the left side of her face. Dr. Clovis Riley came and evaluated her. We gave her 50mg  of Benadryl and her symptoms improved. SPM  I gave the patient 1105ml of isovue 300. As i was getting her up after the scan she was itching the Left side of her face. She had developed two hives on the left side of her face. Dr.  Clovis Riley came and evaluated her. We gave her 50mg  of Benadryl and her symptoms improved. SPM I gave the patient 180ml of isovue 300. As i was getting her up after the scan she was itching the Left side of her face. She had developed two hives on the left side of her face. Dr. Clovis Riley came and evaluated her. We gave her 50mg  of Benadryl and her symptoms improved. SPM I gave the patient 149ml of isovue 300. As i was getting her up after the scan she was itching the Left side of her face. She had developed two hives on the left side of her face. Dr. Clovis Riley came and evaluated her. We gave her 50mg  of Benadryl and her symptoms improved. SPM   . Metrizamide Hives and Itching    I gave the patient 168ml of isovue 300. As i was getting her up after the scan she was itching the Left side of her face. She had developed two hives on the left  side of her face. Dr. Clovis Riley came and evaluated her. We gave her 50mg  of Benadryl and her symptoms improved. SPM    Current Outpatient Medications  Medication Sig Dispense Refill  . allopurinol (ZYLOPRIM) 100 MG tablet Take 100 mg by mouth daily.    Marland Kitchen amLODipine-benazepril (LOTREL) 5-20 MG capsule Take 1 capsule by mouth daily.    Marland Kitchen aspirin EC 81 MG tablet Take 162 mg by mouth daily.    Marland Kitchen atorvastatin (LIPITOR) 20 MG tablet Take 20 mg by mouth daily.    . Calcium Carbonate-Vit D-Min (CALTRATE PLUS PO) Take 1 tablet by mouth daily.     . cholecalciferol (VITAMIN D) 1000 UNITS tablet Take 1,000 Units by mouth daily.    . Ciclopirox 1 % shampoo APP ON THE SKIN UTD    . COMBIGAN 0.2-0.5 % ophthalmic solution INT 1 GTT IN OU BID  5  . Cyanocobalamin (VITAMIN B-12) 5000 MCG SUBL Place 5,000 mcg under the tongue daily.    . hydrochlorothiazide (HYDRODIURIL) 25 MG tablet Take 25 mg by mouth daily.    . Multiple Vitamin (MULTIVITAMIN) tablet Take 1 tablet by mouth daily.    . cetirizine (ZYRTEC) 10 MG tablet Take by mouth as needed.    . clobetasol ointment (TEMOVATE) 0.05 % Apply to affected area every night for 4 weeks, then every other day for 4 weeks and then twice a week for 4 weeks or until resolution. 30 g 5   No current facility-administered medications for this visit.    Review of Systems  Constitutional: Negative for chills, fatigue, fever and unexpected weight change.  HENT: Negative for trouble swallowing.  Eyes: Negative for loss of vision.  Respiratory: Negative for cough, shortness of breath and wheezing.  Cardiovascular: Negative for chest pain, leg swelling, palpitations and syncope.  GI: Negative for abdominal pain, blood in stool, diarrhea, nausea and vomiting.  GU: Negative for difficulty urinating, dysuria, frequency and hematuria.  Musculoskeletal: Negative for back pain, leg pain and joint pain.  Skin: Negative for rash.  Neurological: Negative for dizziness, headaches,  light-headedness, numbness and seizures.  Psychiatric: Negative for behavioral problem, confusion, depressed mood and sleep disturbance.        Objective:  Objective   Vitals:   11/16/19 0950  BP: (!) 162/98  Weight: 250 lb (113.4 kg)  Height: 5\' 7"  (1.702 m)   Body mass index is 39.16 kg/m.  Physical Exam Vitals and nursing note reviewed.  Constitutional:      Appearance:  She is well-developed.  HENT:     Head: Normocephalic and atraumatic.  Eyes:     Pupils: Pupils are equal, round, and reactive to light.  Cardiovascular:     Rate and Rhythm: Normal rate and regular rhythm.  Pulmonary:     Effort: Pulmonary effort is normal. No respiratory distress.  Genitourinary:    Comments: External: Left labial elongated. Small mole remnant or sebaceous cyst on the posterior aspect of the left labia. Some loss of pigmentation noted on vulva and perineum.  Skin:    General: Skin is warm and dry.  Neurological:     Mental Status: She is alert and oriented to person, place, and time.  Psychiatric:        Behavior: Behavior normal.        Thought Content: Thought content normal.        Judgment: Judgment normal.            Assessment/Plan:     86 yo with left labial elongation, small remnant of mole, or sebaceous cyst present.  Photos taken and loaded in media of the  labia.  Normal mole remnant or sebaceous cyst is bothersome to her. Discussed office removal if desired. Will consider for follow up.   Itching of labia. Skin changes suggestive of lichen sclerosis. Will trial  clobetasol cream for itching and follow up.  Would like to avoid labiaplasty at this time.  More than 30 minutes were spent face to face with the patient in the room, reviewing the medical record, labs and images, and coordinating care for the patient. The plan of management was discussed in detail and counseling was provided.

## 2019-12-29 DIAGNOSIS — N1831 Chronic kidney disease, stage 3a: Secondary | ICD-10-CM | POA: Insufficient documentation

## 2020-02-16 ENCOUNTER — Ambulatory Visit (INDEPENDENT_AMBULATORY_CARE_PROVIDER_SITE_OTHER): Payer: Medicare Other | Admitting: Obstetrics and Gynecology

## 2020-02-16 ENCOUNTER — Other Ambulatory Visit: Payer: Self-pay

## 2020-02-16 ENCOUNTER — Encounter: Payer: Self-pay | Admitting: Obstetrics and Gynecology

## 2020-02-16 VITALS — BP 124/70 | Ht 66.0 in | Wt 244.2 lb

## 2020-02-16 DIAGNOSIS — L723 Sebaceous cyst: Secondary | ICD-10-CM

## 2020-02-16 NOTE — Progress Notes (Signed)
Patient ID: Betty Atkins, female   DOB: 12-08-1932, 86 y.o.   MRN: [id number]  Reason for Consult: Gynecologic Exam (follow up Pt states the vaginal cream took care of her issue.)   Referred by Marinda Elk, MD  Subjective:     HPI:  Betty Atkins is a 86 y.o. female Dub Mikes reports that her clobetasol ointment has improved her symptoms of itching.  She has been using the ointment 1-2 times a day for the last 3 months.  She reports that occasionally she misses doses but generally she is taking it.  She has been happy with the improvement in her symptoms.  She has other itching in her scalp and underneath her arms.  She has seen a dermatologist for this in the past. She was prescribed a shampoo that she uses.  She reports that the shampoo does help. She takes an allergy pill occasionally which also helps usually for 4-5 days afterwards.   She additionally reports that for the last 5 years she has had a black head under her arm which drains purulent fluid when expressed. She has not been able to drain it for some time. It is bothersome to her.   Past Medical History:  Diagnosis Date  . Anemia   . Arthritis    degenerative  . Diabetes mellitus without complication ([place])   . Gout involving toe   . Hyperlipidemia   . Hypertension    Family History  Problem Relation Age of Onset  . Breast cancer Sister 25  . Diabetes Sister    Past Surgical History:  Procedure Laterality Date  . ABDOMINAL HYSTERECTOMY    . COLONOSCOPY WITH PROPOFOL N/A 05/02/2015   Procedure: COLONOSCOPY WITH PROPOFOL;  Surgeon: Lollie Sails, MD;  Location: [facility] ENDOSCOPY;  Service: Endoscopy;  Laterality: N/A;  . JOINT REPLACEMENT     right total hip    Short Social History:  Social History   Tobacco Use  . Smoking status: Never Smoker  . Smokeless tobacco: Never Used  Substance Use Topics  . Alcohol use: No    Allergies  Allergen Reactions  . Iodinated Diagnostic Agents Hives and  Itching    I gave the patient 154ml of isovue 300. As i was getting her up after the scan she was itching the Left side of her face. She had developed two hives on the left side of her face. Dr. Clovis Riley came and evaluated her. We gave her 50mg  of Benadryl and her symptoms improved. SPM I gave the patient 132ml of isovue 300. As i was getting her up after the scan she was itching the Left side of her face. She had developed two hives on the left side of her face. Dr. Clovis Riley came and evaluated her. We gave her 50mg  of Benadryl and her symptoms improved. SPM I gave the patient 138ml of isovue 300. As i was getting her up after the scan she was itching the Left side of her face. She had developed two hives on the left side of her face. Dr. Clovis Riley came and evaluated her. We gave her 50mg  of Benadryl and her symptoms improved. SPM  I gave the patient 192ml of isovue 300. As i was getting her up after the scan she was itching the Left side of her face. She had developed two hives on the left side of her face. Dr. Clovis Riley came and evaluated her. We gave her 50mg  of Benadryl and her symptoms improved. SPM I gave the patient  166ml of isovue 300. As i was getting her up after the scan she was itching the Left side of her face. She had developed two hives on the left side of her face. Dr. Clovis Riley came and evaluated her. We gave her 50mg  of Benadryl and her symptoms improved. SPM I gave the patient 185ml of isovue 300. As i was getting her up after the scan she was itching the Left side of her face. She had developed two hives on the left side of her face. Dr. Clovis Riley came and evaluated her. We gave her 50mg  of Benadryl and her symptoms improved. SPM   . Metrizamide Hives and Itching    I gave the patient 123ml of isovue 300. As i was getting her up after the scan she was itching the Left side of her face. She had developed two hives on the left side of her face. Dr. Clovis Riley came and evaluated her. We gave her 50mg  of  Benadryl and her symptoms improved. SPM    Current Outpatient Medications  Medication Sig Dispense Refill  . allopurinol (ZYLOPRIM) 100 MG tablet Take 100 mg by mouth daily.    Marland Kitchen amLODipine-benazepril (LOTREL) 5-20 MG capsule Take 1 capsule by mouth daily.    Marland Kitchen aspirin EC 81 MG tablet Take 162 mg by mouth daily.    Marland Kitchen atorvastatin (LIPITOR) 20 MG tablet Take 20 mg by mouth daily.    Marland Kitchen atorvastatin (LIPITOR) 40 MG tablet Take 40 mg by mouth at bedtime.    . Calcium Carbonate-Vit D-Min (CALTRATE PLUS PO) Take 1 tablet by mouth daily.     . cetirizine (ZYRTEC) 10 MG tablet Take by mouth as needed.    . cholecalciferol (VITAMIN D) 1000 UNITS tablet Take 1,000 Units by mouth daily.    . Ciclopirox 1 % shampoo APP ON THE SKIN UTD    . clobetasol ointment (TEMOVATE) 0.05 % Apply to affected area every night for 4 weeks, then every other day for 4 weeks and then twice a week for 4 weeks or until resolution. 30 g 5  . COMBIGAN 0.2-0.5 % ophthalmic solution INT 1 GTT IN OU BID  5  . Cyanocobalamin (VITAMIN B-12) 5000 MCG SUBL Place 5,000 mcg under the tongue daily.    . hydrochlorothiazide (HYDRODIURIL) 25 MG tablet Take 25 mg by mouth daily.    . Multiple Vitamin (MULTIVITAMIN) tablet Take 1 tablet by mouth daily.    . timolol (TIMOPTIC) 0.5 % ophthalmic solution Apply to eye.     No current facility-administered medications for this visit.    Review of Systems  Constitutional: Negative for chills, fatigue, fever and unexpected weight change.  HENT: Negative for trouble swallowing.  Eyes: Negative for loss of vision.  Respiratory: Negative for cough, shortness of breath and wheezing.  Cardiovascular: Negative for chest pain, leg swelling, palpitations and syncope.  GI: Negative for abdominal pain, blood in stool, diarrhea, nausea and vomiting.  GU: Negative for difficulty urinating, dysuria, frequency and hematuria.  Musculoskeletal: Negative for back pain, leg pain and joint pain.  Skin:  Negative for rash.  Neurological: Negative for dizziness, headaches, light-headedness, numbness and seizures.  Psychiatric: Negative for behavioral problem, confusion, depressed mood and sleep disturbance.        Objective:  Objective   Vitals:   02/16/20 1004  BP: 124/70  Weight: (!) 244 lb 3.2 oz (110.8 kg)  Height: 5\' 6"  (1.676 m)   Body mass index is 39.41 kg/m.  Physical Exam Vitals and  nursing note reviewed.  Constitutional:      Appearance: She is well-developed.  HENT:     Head: Normocephalic and atraumatic.  Eyes:     Pupils: Pupils are equal, round, and reactive to light.  Cardiovascular:     Rate and Rhythm: Normal rate and regular rhythm.  Pulmonary:     Effort: Pulmonary effort is normal. No respiratory distress.  Chest:    Skin:    General: Skin is warm and dry.  Neurological:     Mental Status: She is alert and oriented to person, place, and time.  Psychiatric:        Behavior: Behavior normal.        Thought Content: Thought content normal.        Judgment: Judgment normal.         Assessment/Plan:     Discussed continued treatment of lichen sclerosis with clobetasol ointment.  Discussed decreasing therapy to one to two times a week  And continuing this as maintenance Discussed potentially switching to a moderate potency steroid in the future. Discussed hydration, allergy pill twice a week and moisturizing to improve generalized itching.  Will refer patient to general surgery for removal of under the arm sebaceous cyst.   More than 15 minutes were spent face to face with the patient in the room, reviewing the medical record, labs and images, and coordinating care for the patient. The plan of management was discussed in detail and counseling was provided.     Adrian Prows MD [facility], [facility] 02/16/2020 10:29 AM

## 2021-04-02 ENCOUNTER — Encounter: Payer: Self-pay | Admitting: Anesthesiology

## 2021-04-02 ENCOUNTER — Encounter: Payer: Self-pay | Admitting: Ophthalmology

## 2021-04-09 NOTE — Discharge Instructions (Signed)

## 2021-04-11 ENCOUNTER — Ambulatory Visit: Admission: RE | Admit: 2021-04-11 | Payer: Medicare Other | Source: Ambulatory Visit | Admitting: Ophthalmology

## 2021-04-11 HISTORY — DX: Dyspnea, unspecified: R06.00

## 2021-04-11 HISTORY — DX: Prediabetes: R73.03

## 2021-04-11 SURGERY — PHACOEMULSIFICATION, CATARACT, WITH IOL INSERTION
Anesthesia: Topical | Laterality: Left

## 2021-05-09 ENCOUNTER — Ambulatory Visit: Admit: 2021-05-09 | Payer: Medicare Other | Admitting: Ophthalmology

## 2021-05-09 SURGERY — PHACOEMULSIFICATION, CATARACT, WITH IOL INSERTION
Anesthesia: Topical | Laterality: Right

## 2021-10-22 ENCOUNTER — Other Ambulatory Visit: Payer: Self-pay

## 2021-10-22 ENCOUNTER — Emergency Department: Payer: Medicare Other

## 2021-10-22 ENCOUNTER — Inpatient Hospital Stay
Admission: EM | Admit: 2021-10-22 | Discharge: 2021-10-24 | DRG: 063 | Disposition: A | Discharge status: Home Health | Payer: Medicare Other | Attending: Student | Admitting: Student

## 2021-10-22 DIAGNOSIS — Z833 Family history of diabetes mellitus: Secondary | ICD-10-CM

## 2021-10-22 DIAGNOSIS — M109 Gout, unspecified: Secondary | ICD-10-CM | POA: Diagnosis present

## 2021-10-22 DIAGNOSIS — Z6835 Body mass index (BMI) 35.0-35.9, adult: Secondary | ICD-10-CM

## 2021-10-22 DIAGNOSIS — Z7982 Long term (current) use of aspirin: Secondary | ICD-10-CM | POA: Diagnosis not present

## 2021-10-22 DIAGNOSIS — R29898 Other symptoms and signs involving the musculoskeletal system: Secondary | ICD-10-CM | POA: Diagnosis present

## 2021-10-22 DIAGNOSIS — Z91041 Radiographic dye allergy status: Secondary | ICD-10-CM | POA: Diagnosis not present

## 2021-10-22 DIAGNOSIS — I10 Essential (primary) hypertension: Secondary | ICD-10-CM | POA: Diagnosis present

## 2021-10-22 DIAGNOSIS — Z90710 Acquired absence of both cervix and uterus: Secondary | ICD-10-CM | POA: Diagnosis not present

## 2021-10-22 DIAGNOSIS — Z803 Family history of malignant neoplasm of breast: Secondary | ICD-10-CM

## 2021-10-22 DIAGNOSIS — Z96641 Presence of right artificial hip joint: Secondary | ICD-10-CM | POA: Diagnosis present

## 2021-10-22 DIAGNOSIS — M1990 Unspecified osteoarthritis, unspecified site: Secondary | ICD-10-CM | POA: Diagnosis present

## 2021-10-22 DIAGNOSIS — Z888 Allergy status to other drugs, medicaments and biological substances status: Secondary | ICD-10-CM | POA: Diagnosis not present

## 2021-10-22 DIAGNOSIS — I639 Cerebral infarction, unspecified: Secondary | ICD-10-CM | POA: Diagnosis not present

## 2021-10-22 DIAGNOSIS — R000 Tachycardia, unspecified: Secondary | ICD-10-CM | POA: Diagnosis present

## 2021-10-22 DIAGNOSIS — Z79899 Other long term (current) drug therapy: Secondary | ICD-10-CM

## 2021-10-22 DIAGNOSIS — E876 Hypokalemia: Secondary | ICD-10-CM | POA: Diagnosis present

## 2021-10-22 DIAGNOSIS — R29701 NIHSS score 1: Secondary | ICD-10-CM | POA: Diagnosis present

## 2021-10-22 DIAGNOSIS — I63231 Cerebral infarction due to unspecified occlusion or stenosis of right carotid arteries: Secondary | ICD-10-CM | POA: Diagnosis present

## 2021-10-22 DIAGNOSIS — G8324 Monoplegia of upper limb affecting left nondominant side: Secondary | ICD-10-CM | POA: Diagnosis present

## 2021-10-22 DIAGNOSIS — E669 Obesity, unspecified: Secondary | ICD-10-CM | POA: Diagnosis present

## 2021-10-22 DIAGNOSIS — M6281 Muscle weakness (generalized): Secondary | ICD-10-CM | POA: Diagnosis present

## 2021-10-22 DIAGNOSIS — E785 Hyperlipidemia, unspecified: Secondary | ICD-10-CM | POA: Diagnosis present

## 2021-10-22 DIAGNOSIS — E119 Type 2 diabetes mellitus without complications: Secondary | ICD-10-CM | POA: Diagnosis present

## 2021-10-22 DIAGNOSIS — H539 Unspecified visual disturbance: Secondary | ICD-10-CM | POA: Diagnosis present

## 2021-10-22 DIAGNOSIS — I6521 Occlusion and stenosis of right carotid artery: Secondary | ICD-10-CM | POA: Diagnosis not present

## 2021-10-22 LAB — COMPREHENSIVE METABOLIC PANEL
ALT: 25 U/L (ref 0–44)
AST: 26 U/L (ref 15–41)
Albumin: 4.2 g/dL (ref 3.5–5.0)
Alkaline Phosphatase: 62 U/L (ref 38–126)
Anion gap: 7 (ref 5–15)
BUN: 15 mg/dL (ref 8–23)
CO2: 27 mmol/L (ref 22–32)
Calcium: 9.8 mg/dL (ref 8.9–10.3)
Chloride: 105 mmol/L (ref 98–111)
Creatinine, Ser: 1.06 mg/dL — ABNORMAL HIGH (ref 0.44–1.00)
GFR, Estimated: 51 mL/min — ABNORMAL LOW (ref >60)
Glucose, Bld: 106 mg/dL — ABNORMAL HIGH (ref 70–99)
Potassium: 3.6 mmol/L (ref 3.5–5.1)
Sodium: 139 mmol/L (ref 135–145)
Total Bilirubin: 0.5 mg/dL (ref 0.3–1.2)
Total Protein: 8 g/dL (ref 6.5–8.1)

## 2021-10-22 LAB — LIPID PANEL
Cholesterol: 183 mg/dL (ref 0–200)
HDL: 51 mg/dL (ref >40)
LDL Cholesterol: 90 mg/dL (ref 0–99)
Total CHOL/HDL Ratio: 3.6 RATIO
Triglycerides: 211 mg/dL — ABNORMAL HIGH (ref <150)
VLDL: 42 mg/dL — ABNORMAL HIGH (ref 0–40)

## 2021-10-22 LAB — TROPONIN I (HIGH SENSITIVITY): Troponin I (High Sensitivity): 7 ng/L (ref <18)

## 2021-10-22 LAB — DIFFERENTIAL
Abs Immature Granulocytes: 0.03 10*3/uL (ref 0.00–0.07)
Basophils Absolute: 0 10*3/uL (ref 0.0–0.1)
Basophils Relative: 1 %
Eosinophils Absolute: 0.3 10*3/uL (ref 0.0–0.5)
Eosinophils Relative: 5 %
Immature Granulocytes: 0 %
Lymphocytes Relative: 42 %
Lymphs Abs: 3 10*3/uL (ref 0.7–4.0)
Monocytes Absolute: 0.6 10*3/uL (ref 0.1–1.0)
Monocytes Relative: 9 %
Neutro Abs: 3.1 10*3/uL (ref 1.7–7.7)
Neutrophils Relative %: 43 %

## 2021-10-22 LAB — MRSA NEXT GEN BY PCR, NASAL: MRSA by PCR Next Gen: NOT DETECTED

## 2021-10-22 LAB — CBC
HCT: 43.2 % (ref 36.0–46.0)
Hemoglobin: 14.2 g/dL (ref 12.0–15.0)
MCH: 29.4 pg (ref 26.0–34.0)
MCHC: 32.9 g/dL (ref 30.0–36.0)
MCV: 89.4 fL (ref 80.0–100.0)
Platelets: 280 10*3/uL (ref 150–400)
RBC: 4.83 MIL/uL (ref 3.87–5.11)
RDW: 12.8 % (ref 11.5–15.5)
WBC: 7.1 10*3/uL (ref 4.0–10.5)
nRBC: 0 % (ref 0.0–0.2)

## 2021-10-22 LAB — CBG MONITORING, ED: Glucose-Capillary: 112 mg/dL — ABNORMAL HIGH (ref 70–99)

## 2021-10-22 LAB — PROTIME-INR
INR: 1 (ref 0.8–1.2)
Prothrombin Time: 12.7 seconds (ref 11.4–15.2)

## 2021-10-22 LAB — APTT: aPTT: 32 seconds (ref 24–36)

## 2021-10-22 LAB — GLUCOSE, CAPILLARY: Glucose-Capillary: 138 mg/dL — ABNORMAL HIGH (ref 70–99)

## 2021-10-22 MED ORDER — DOCUSATE SODIUM 100 MG PO CAPS: 100.0000 mg | ORAL_CAPSULE | Freq: Two times a day (BID) | ORAL | Status: DC | PRN

## 2021-10-22 MED ORDER — ADULT MULTIVITAMIN W/MINERALS CH
1.0000 | ORAL_TABLET | Freq: Every day | ORAL | Status: DC
  Administered 2021-10-23 – 2021-10-24 (×2): 1 via ORAL
  Filled 2021-10-22 (×2): qty 1

## 2021-10-22 MED ORDER — GADOBUTROL 1 MMOL/ML IV SOLN
10.0000 mL | Freq: Once | INTRAVENOUS | Status: AC | PRN
  Administered 2021-10-22: 10 mL via INTRAVENOUS

## 2021-10-22 MED ORDER — VITAMIN D 25 MCG (1000 UNIT) PO TABS
1000.0000 [IU] | ORAL_TABLET | Freq: Every day | ORAL | Status: DC
  Administered 2021-10-23 – 2021-10-24 (×2): 1000 [IU] via ORAL
  Filled 2021-10-22 (×2): qty 1

## 2021-10-22 MED ORDER — TENECTEPLASE FOR STROKE
0.2500 mg/kg | PACK | Freq: Once | INTRAVENOUS | Status: AC
  Administered 2021-10-22: 25 mg via INTRAVENOUS

## 2021-10-22 MED ORDER — CHLORHEXIDINE GLUCONATE CLOTH 2 % EX PADS
6.0000 | MEDICATED_PAD | Freq: Every day | CUTANEOUS | Status: DC
  Administered 2021-10-23 – 2021-10-24 (×2): 6 via TOPICAL

## 2021-10-22 MED ORDER — ATORVASTATIN CALCIUM 20 MG PO TABS
40.0000 mg | ORAL_TABLET | Freq: Every day | ORAL | Status: DC
  Administered 2021-10-23: 40 mg via ORAL
  Filled 2021-10-22: qty 2

## 2021-10-22 MED ORDER — ASPIRIN 81 MG PO CHEW: 162.0000 mg | CHEWABLE_TABLET | Freq: Once | ORAL | Status: DC

## 2021-10-22 MED ORDER — LORATADINE 10 MG PO TABS: 10.0000 mg | ORAL_TABLET | Freq: Every day | ORAL | Status: DC | PRN

## 2021-10-22 MED ORDER — POLYETHYLENE GLYCOL 3350 17 G PO PACK: 17.0000 g | PACK | Freq: Every day | ORAL | Status: DC | PRN

## 2021-10-22 MED ORDER — ONDANSETRON HCL 4 MG/2ML IJ SOLN: 4.0000 mg | Freq: Four times a day (QID) | INTRAMUSCULAR | Status: DC | PRN

## 2021-10-22 MED ORDER — SODIUM CHLORIDE 0.9% FLUSH
3.0000 mL | Freq: Once | INTRAVENOUS | Status: AC
  Administered 2021-10-22: 3 mL via INTRAVENOUS

## 2021-10-22 MED ORDER — ALLOPURINOL 100 MG PO TABS
100.0000 mg | ORAL_TABLET | Freq: Every day | ORAL | Status: DC
Start: 2021-10-23 — End: 2021-10-24
  Administered 2021-10-23 – 2021-10-24 (×2): 100 mg via ORAL
  Filled 2021-10-22 (×2): qty 1

## 2021-10-22 MED ORDER — ACETAMINOPHEN 325 MG PO TABS: 650.0000 mg | ORAL_TABLET | ORAL | Status: DC | PRN

## 2021-10-22 NOTE — Consult Note (Signed)
Neurology Consultation ?Reason for Consult: Left hand weakness ?Requesting Physician: Blake Divine ? ?CC: Left hand episodic weakness  ? ?History is obtained from: Patient, daughter at bedside, and chart review ? ?HPI: Betty Atkins is a 88 y.o. female with past medical history significant for hypertension, hyperlipidemia, prediabetes, obesity, gout, osteoarthritis and anemia.  ? ?She has been in her usual state of health other than some stress, but did note some dots in her vision on Saturday morning that lasted about 5 minutes which she did not think too much of.  However this morning she woke up normal at 7 AM and subsequently at 9:30 AM noticed some left hand curling of the fingers that lasted about 15 to 20 minutes.  Her hands seem to recover, however 45 minutes later she had a recurrent episode which was again resolving by the time she came to the ED. ? ?Thrombolytic checklist was reviewed with patient and given she had no contraindications and was amenable to receiving this treatment, TNK was administered ? ?LKW: 9:30 AM on 4/3 ?tPA given?:  Yes, due to potential for worsening of a stuttering lacunar stroke ?IA performed?: No, no evidence of LVO ?Premorbid modified rankin scale:  ?    0 - No symptoms. ?  ?ROS: All other review of systems was negative except as noted in the HPI.  ? ?Past Medical History:  ?Diagnosis Date  ? Anemia   ? Arthritis   ? degenerative  ? Dyspnea   ? Gout involving toe   ? Hyperlipidemia   ? Hypertension   ? Pre-diabetes   ? ?Past Surgical History:  ?Procedure Laterality Date  ? ABDOMINAL HYSTERECTOMY    ? COLONOSCOPY WITH PROPOFOL N/A 05/02/2015  ? Procedure: COLONOSCOPY WITH PROPOFOL;  Surgeon: Lollie Sails, MD;  Location: [facility] ENDOSCOPY;  Service: Endoscopy;  Laterality: N/A;  ? JOINT REPLACEMENT    ? right total hip  ? ? ?Current Facility-Administered Medications:  ?  aspirin chewable tablet 162 mg, 162 mg, Oral, Once, Blake Divine, MD ? ?Current Outpatient  Medications:  ?  allopurinol (ZYLOPRIM) 100 MG tablet, Take 100 mg by mouth daily., Disp: , Rfl:  ?  amLODipine-benazepril (LOTREL) 5-20 MG capsule, Take 1 capsule by mouth daily., Disp: , Rfl:  ?  aspirin EC 81 MG tablet, Take 162 mg by mouth daily., Disp: , Rfl:  ?  atorvastatin (LIPITOR) 20 MG tablet, Take 40 mg by mouth daily., Disp: , Rfl:  ?  Calcium Carbonate-Vit D-Min (CALTRATE PLUS PO), Take 1 tablet by mouth daily. , Disp: , Rfl:  ?  cetirizine (ZYRTEC) 10 MG tablet, Take by mouth as needed., Disp: , Rfl:  ?  Ciclopirox 1 % shampoo, APP ON THE SKIN UTD, Disp: , Rfl:  ?  clobetasol ointment (TEMOVATE) 0.05 %, Apply to affected area every night for 4 weeks, then every other day for 4 weeks and then twice a week for 4 weeks or until resolution., Disp: 30 g, Rfl: 5 ?  COMBIGAN 0.2-0.5 % ophthalmic solution, INT 1 GTT IN OU BID (Patient not taking: Reported on 04/02/2021), Disp: , Rfl: 5 ?  Cyanocobalamin (VITAMIN B-12) 5000 MCG SUBL, Place 5,000 mcg under the tongue daily. (Patient not taking: Reported on 04/02/2021), Disp: , Rfl:  ?  hydrochlorothiazide (HYDRODIURIL) 25 MG tablet, Take 25 mg by mouth daily., Disp: , Rfl:  ?  Multiple Vitamin (MULTIVITAMIN) tablet, Take 1 tablet by mouth daily., Disp: , Rfl:  ?  timolol (TIMOPTIC) 0.5 % ophthalmic solution,  Apply to eye., Disp: , Rfl:  ?  Vitamin D3 (VITAMIN D) 25 MCG tablet, Take 1,000 Units by mouth daily., Disp: , Rfl:  ? ? ?Family History  ?Problem Relation Age of Onset  ? Breast cancer Sister 60  ? Diabetes Sister   ? ? ? ?Social History:  reports that she has never smoked. She has never used smokeless tobacco. She reports that she does not drink alcohol and does not use drugs. ? ?Exam: ?Current vital signs: ?BP (!) 144/92   Pulse (!) 102   Temp 98 ?F (36.7 ?C)   Resp 18   SpO2 96%  ?Vital signs in last 24 hours: ?Temp:  [98 ?F (36.7 ?C)] 98 ?F (36.7 ?C) (04/03 1117) ?Pulse Rate:  [102] 102 (04/03 1117) ?Resp:  [18] 18 (04/03 1117) ?BP: (144)/(92)  144/92 (04/03 1117) ?SpO2:  [96 %] 96 % (04/03 1117) ? ? ?Physical Exam  ?Constitutional: Appears well-developed and well-nourished.  ?Psych: Affect appropriate to situation, intermittently tearful ?Eyes: No scleral injection other than the right after crying ?HENT: No oropharyngeal obstruction.  ?MSK: no joint deformities.  ?Cardiovascular: Normal rate and regular rhythm on monitor, perfusing extremities well ?Respiratory: Effort normal, non-labored breathing ?GI: Soft.  No distension. There is no tenderness.  ?Skin: Warm dry and intact visible skin ? ?Neuro: ?Mental Status: ?Patient is awake, alert, oriented to person, place, month, year, and situation. ?Patient is able to give a clear and coherent history. ?No signs of aphasia or neglect ?Cranial Nerves: ?II: Visual Fields are full. Pupils are equal, round, and reactive to light.   ?III,IV, VI: EOMI with slight left eye ptosis which she states is her baseline ?V: Facial sensation is symmetric to temperature ?VII: Facial movement is notable for mild right nasolabial fold flattening which patient and family report is her baseline.  ?VIII: hearing is intact to voice ?X: Uvula elevates symmetrically ?XI: Shoulder shrug is symmetric. ?XII: tongue is midline without atrophy or fasciculations.  ?Motor: ?Tone is normal. Bulk is normal. 5/5 strength was present in all four extremities, other than left hand finger extension weakness 4/5, as well as hip flexion weakness 4/5 bilaterally which she attributes to prior hip surgeries.  She additionally has pronation of the left hand without drift ?Sensory: ?Sensation is symmetric to light touch and temperature in the arms and legs, however she has slight tingling in the left fingers.  Tinel's is negative as is the ulnar tunnel testing ?Deep Tendon Reflexes: ?3+ and symmetric in the brachioradialis and 2+ symmetric patellae.  ?Plantars: ?Toes are downgoing bilaterally.  ?Cerebellar: ?FNF and HKS are intact bilaterally ?Gait:   ?Deferred in acute setting  ? ?NIHSS total 1 ?Score breakdown: 1 for left upper extremity sensory change, right nasolabial fold flattening not scored given it is her baseline ?Performed within 5 minutes of code stroke activation  ? ? ?I have reviewed labs in epic and the results pertinent to this consultation are: ? ?Basic Metabolic Panel: ?Recent Labs  ?Lab 10/22/21 ?1150  ?NA 139  ?K 3.6  ?CL 105  ?CO2 27  ?GLUCOSE 106*  ?BUN 15  ?CREATININE 1.06*  ?CALCIUM 9.8  ? ? ?CBC: ?Recent Labs  ?Lab 10/22/21 ?1150  ?WBC 7.1  ?NEUTROABS 3.1  ?HGB 14.2  ?HCT 43.2  ?MCV 89.4  ?PLT 280  ? ? ?Coagulation Studies: ?Recent Labs  ?  10/22/21 ?1150  ?LABPROT 12.7  ?INR 1.0  ?  ? ? ?I have reviewed the images obtained: ? ?Head CT personally reviewed,  agree that it is negative for any acute intracranial process ? ? 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  Certainly she has a great deal of small vessel risk factors, and given that she is not fully back to baseline (still has some left hand weakness), I do not want her to risk being out of the window should her symptoms worsen.  ? ?Recommendations: ?# Lacunar stroke ?- s/p TNK at noon ?- Stroke labs HgbA1c, fasting lipid panel ?- MRI brain  ?- MRA of the brain without contrast and MRA neck w/wo (CTA contraindicated due to contrast allergy) ?- Frequent neuro checks per protocol ?- Echocardiogram ?- HOLD antiplatelets until 24 hours post TNK scan is stable  ?- Risk factor modification ?- Telemetry monitoring ?- Blood pressure goal  ? - Post tPA for 24  hours < 180/105 ?- PT consult, OT consult, unless patient is back to baseline, no speech or language deficits therefore SLP not indicated ?- Neurology to follow ? ?Lesleigh Noe MD-PhD ?[facility] ?[phone] ?[facility] coverage for [facility] is from 8 AM to 4 AM in-house and 4 PM to 8 PM by telephone/video. 8 PM to 8 AM emergent questions or overnight urgent questions should be addressed to Teleneurology On-call or [facility]

## 2021-10-22 NOTE — ED Notes (Signed)
Pt returned from MRI °

## 2021-10-22 NOTE — ED Notes (Signed)
Pt taken to CT 2 at this time. Family taken to ED 1 ?

## 2021-10-22 NOTE — Progress Notes (Signed)
?  Transition of Care (TOC) Screening Note ? ? ?Patient Details  ?Name: Betty Atkins ?Date of Birth: Dec 16, 1932 ? ? ?Transition of Care (TOC) CM/SW Contact:    ?Shelbie Hutching, RN ?Phone Number: ?10/22/2021, 4:14 PM ? ? ? ?Transition of Care Department [facility]) has reviewed patient and no TOC needs have been identified at this time. We will continue to monitor patient advancement through interdisciplinary progression rounds. If new patient transition needs arise, please place a TOC consult. ?  ?

## 2021-10-22 NOTE — ED Notes (Signed)
Pt in CT at this time.

## 2021-10-22 NOTE — ED Notes (Signed)
Pt to mri 

## 2021-10-22 NOTE — ED Notes (Signed)
Neuro at CT scanner at this time. ?

## 2021-10-22 NOTE — ED Provider Notes (Signed)
? ?[facility] ?Provider Note ? ? ? Event Date/Time  ? First MD Initiated Contact with Patient 10/22/21 1128   ?  (approximate) ? ? ?History  ? ?Chief Complaint ?Weakness and Numbness ? ? ?HPI ? ?Betty Atkins is a 88 y.o. female with past medical history of hypertension, hyperlipidemia, diabetes, and anemia who presents to the ED complaining of weakness.  Patient reports that she was sitting and watching TV around 930 this morning when she suddenly started to notice the fingers of her left hand curling inwards.  Around the same time, she began to feel weak in her left arm with some difficulty using it and numbness around her fingers.  She denies any associated vision changes or speech changes, has not noticed any numbness or weakness in her left leg.  She does state that she had a brief episode of "seeing spots" in her vision 2 days ago that lasted for about 5 minutes before resolving.  She has been feeling well recently with no fevers, cough, chest pain, shortness of breath, dysuria, nausea, or vomiting. ?  ? ? ?Physical Exam  ? ?Triage Vital Signs: ?ED Triage Vitals [10/22/21 1117]  ?Enc Vitals Group  ?   BP (!) 144/92  ?   Pulse Rate (!) 102  ?   Resp 18  ?   Temp 98 ?F (36.7 ?C)  ?   Temp src   ?   SpO2 96 %  ?   Weight   ?   Height   ?   Head Circumference   ?   Peak Flow   ?   Pain Score   ?   Pain Loc   ?   Pain Edu?   ?   Excl. in [place]?   ? ? ?Most recent vital signs: ?Vitals:  ? 10/22/21 1117 10/22/21 1218  ?BP: (!) 144/92 130/61  ?Pulse: (!) 102 79  ?Resp: 18 13  ?Temp: 98 ?F (36.7 ?C)   ?SpO2: 96% 99%  ? ? ?Constitutional: Alert and oriented. ?Eyes: Conjunctivae are normal.  Pupils equal, round, and reactive to light bilaterally. ?Head: Atraumatic. ?Nose: No congestion/rhinnorhea. ?Mouth/Throat: Mucous membranes are moist.  ?Cardiovascular: Normal rate, regular rhythm. Grossly normal heart sounds.  2+ radial pulses bilaterally. ?Respiratory: Normal respiratory effort.  No  retractions. Lungs CTAB. ?Gastrointestinal: Soft and nontender. No distention. ?Musculoskeletal: No lower extremity tenderness nor edema.  ?Neurologic:  Normal speech and language.  4+/5 strength in the left upper extremity, 5 out of 5 strength in the right upper extremity and bilateral lower extremities.  Cranial nerves II through XII are grossly intact. ? ? ? ?ED Results / Procedures / Treatments  ? ?Labs ?(all labs ordered are listed, but only abnormal results are displayed) ?Labs Reviewed  ?COMPREHENSIVE METABOLIC PANEL - Abnormal; Notable for the following components:  ?    Result Value  ? Glucose, Bld 106 (*)   ? Creatinine, Ser 1.06 (*)   ? GFR, Estimated 51 (*)   ? All other components within normal limits  ?CBG MONITORING, ED - Abnormal; Notable for the following components:  ? Glucose-Capillary 112 (*)   ? All other components within normal limits  ?PROTIME-INR  ?APTT  ?CBC  ?DIFFERENTIAL  ?I-STAT CREATININE, ED  ? ? ? ?EKG ? ?ED ECG REPORT ?Tempie Hoist, the attending physician, personally viewed and interpreted this ECG. ? ? Date: 10/22/2021 ? EKG Time: 11:57 ? Rate: 97 ? Rhythm: normal sinus rhythm ? Axis: Normal ?  Intervals:none ? ST&T Change: None ? ?RADIOLOGY ?CT head reviewed by me with no hemorrhage or midline shift. ? ?PROCEDURES: ? ?Critical Care performed: Yes, see critical care procedure note(s) ? ?.Critical Care ?Performed by: Blake Divine, MD ?Authorized by: Blake Divine, MD  ? ?Critical care provider statement:  ?  Critical care time (minutes):  30 ?  Critical care time was exclusive of:  Separately billable procedures and treating other patients and teaching time ?  Critical care was necessary to treat or prevent imminent or life-threatening deterioration of the following conditions:  CNS failure or compromise ?  Critical care was time spent personally by me on the following activities:  Development of treatment plan with patient or surrogate, discussions with consultants,  evaluation of patient's response to treatment, examination of patient, ordering and review of laboratory studies, ordering and review of radiographic studies, ordering and performing treatments and interventions, pulse oximetry, re-evaluation of patient's condition and review of old charts ?  I assumed direction of critical care for this patient from another provider in my specialty: no   ?  Care discussed with: admitting provider   ? ? ?MEDICATIONS ORDERED IN ED: ?Medications  ?aspirin chewable tablet 162 mg (has no administration in time range)  ?sodium chloride flush (NS) 0.9 % injection 3 mL (3 mLs Intravenous Given 10/22/21 1220)  ?tenecteplase (TNKASE) injection for Stroke 0.25 mg/kg (25 mg Intravenous Given 10/22/21 1200)  ? ? ? ?IMPRESSION / MDM / ASSESSMENT AND PLAN / ED COURSE  ?I reviewed the triage vital signs and the nursing notes. ?             ?               ? ?88 y.o. female with past medical history of hypertension, hyperlipidemia, diabetes, and anemia who presents to the ED complaining of acute onset of left arm numbness and weakness around 930 this morning. ? ?Differential diagnosis includes, but is not limited to, stroke, TIA, focal seizure, electrolyte abnormality, hypoglycemia. ? ?Patient is nontoxic-appearing and in no acute distress, vital signs are unremarkable.  The curling of her left fingers appears to have resolved, but patient appears to have ongoing mild weakness in her left upper extremity at the time of arrival.  Code stroke was activated and CT head is negative for acute process.  Case discussed with Dr. Curly Shores of neurology, who evaluated the patient and recommends proceeding with TNK.  Labs are unremarkable, CBC shows no anemia or leukocytosis, BMP without electrolyte abnormality or AKI, LFTs within normal limits.  Case discussed with ICU team for admission. ? ?  ? ? ?FINAL CLINICAL IMPRESSION(S) / ED DIAGNOSES  ? ?Final diagnoses:  ?Left arm weakness  ? ? ? ?Rx / DC Orders  ? ?ED  Discharge Orders   ? ? None  ? ?  ? ? ? ?Note:  This document was prepared using Dragon voice recognition software and may include unintentional dictation errors. ?  ?Blake Divine, MD ?10/22/21 1239 ? ?

## 2021-10-22 NOTE — Progress Notes (Signed)
?   10/22/21 1200  ?Clinical Encounter Type  ?Visited With Family;Patient not available  ?Visit Type Code  ?Spiritual Encounters  ?Spiritual Needs Prayer  ? ?Chaplain responded to Code Stroke. Patient not available. Chaplain provided support to family present. ?

## 2021-10-22 NOTE — ED Notes (Signed)
First Nurse called and informed of calling code stroke and to call secretary and let this RN know what bed is for pt ?

## 2021-10-22 NOTE — H&P (Signed)
? ?NAME:  Betty Atkins, MRN:  [id number], DOB:  10-25-1932, LOS: 0 ?ADMISSION DATE:  10/22/2021, CONSULTATION DATE: 10/22/2021 ?REFERRING MD: Dr. Charna Archer, CHIEF COMPLAINT: Left hand weakness/numbness   ? ?History of Present Illness:  ?This is an 88 yo female who presented to [facility] ER on 04/3 with left hand weakness/numbness and curling of the left fingers that lasted about 15 to 20 minutes than subsided spontaneously.  However, she had a recurrent episode of the curling of the left fingers that lasted 45 minutes prompting ER visit.  Onset of symptoms were at 09:30 am on 04/3. She also reported on 04/1 she had visual changes resulting in seeing dots that lasted 5 minutes which resolved spontaneously. ? ?ED Course ?Upon arrival to the ER per ER providers note the curling of the left fingers had resolved, however pt continued to have mild weakness of the left upper extremity.  Code stroke activated.  CT Head negative for acute intracranial abnormality, however revealed mild to moderate chronic small vessel ischemic changes within the cerebral white matter.  Neurology evaluated pt with recommendation to proceed with TNK administration.  ER lab results were unremarkable and ER vital signs were stable.  PCCM team contacted for ICU admission post TNK.  ? ?Pertinent  Medical History  ?Anemia  ?Degenerative Arthritis  ?Gout ?HLD ?HTN ?Pre-Diabetes ? ?Significant Hospital Events: ?Including procedures, antibiotic start and stop dates in addition to other pertinent events   ?04/3: Pt admitted to ICU left upper extremity numbness/tinging/finger curling concerning for lacunar stroke vs. focal seizures s/p TNK administration  ?04/3: CT Head revealed No evidence of acute intracranial abnormality. Mild-to-moderate chronic small vessel ischemic changes within the cerebral white matter. ? ?Interim History / Subjective:  ?Pt resting in bed no complaints at this time.  VSS on RA  ? ?Objective   ?Blood pressure (!) 161/76, pulse 97,  temperature 98 ?F (36.7 ?C), resp. rate 10, SpO2 98 %. ?   ?   ?No intake or output data in the 24 hours ending 10/22/21 1339 ?There were no vitals filed for this visit. ? ?Examination: ?General: Elderly female resting in bed, NAD on RA ?HENT: Supple, no JVD  ?Lungs: Clear throughout, even, non labored  ?Cardiovascular: NSR, rrr, no m/r/g, 2+ radial/2+ distal pulses, no edema  ?Abdomen: +BS x4, non distended, non tender, obese ?Extremities: Normal bulk and tone, moves all extremities  ?Neuro: Alert and oriented, follows command, 5/5 BUE/BLE motor strength, no BLE/BUE drift  ?GU: Deferred  ? ?Resolved Hospital Problem list   ? ?Assessment & Plan:  ?Left upper extremity weakness/numbness/finger curling concerning for possible lacunar stroke vs. focal seizure s/p TNK administration  ?- Continuous telemetry monitoring  ?- MRA Head/Neck and MR Brain results pending  ?- Lipid panel and hemoglobin A1c pending  ?- Echo pending  ?- BP goal <180/105 for 24hrs post TNK  ?- Avoid antiplatelet and anticoagulation therapy 24hrs post TNK ?- Neurology consulted appreciate input  ?- Neuro checks per NIH stroke scale ?- PT/OT consult ?- Bedside swallow study prior to initiation of diet; may resume outpatient statin  ? ?Best Practice (right click and "Reselect all SmartList Selections" daily)  ? ?Diet/type: NPO ?DVT prophylaxis: SCD ?GI prophylaxis: N/A ?Lines: N/A ?Foley:  N/A ?Code Status:  full code ?Last date of multidisciplinary goals of care discussion [N/A] ? ?Labs   ?CBC: ?Recent Labs  ?Lab 10/22/21 ?1150  ?WBC 7.1  ?NEUTROABS 3.1  ?HGB 14.2  ?HCT 43.2  ?MCV 89.4  ?PLT 280  ? ? ?  Basic Metabolic Panel: ?Recent Labs  ?Lab 10/22/21 ?1150  ?NA 139  ?K 3.6  ?CL 105  ?CO2 27  ?GLUCOSE 106*  ?BUN 15  ?CREATININE 1.06*  ?CALCIUM 9.8  ? ?GFR: ?CrCl cannot be calculated (Unknown ideal weight.). ?Recent Labs  ?Lab 10/22/21 ?1150  ?WBC 7.1  ? ? ?Liver Function Tests: ?Recent Labs  ?Lab 10/22/21 ?1150  ?AST 26  ?ALT 25  ?ALKPHOS 62   ?BILITOT 0.5  ?PROT 8.0  ?ALBUMIN 4.2  ? ?No results for input(s): LIPASE, AMYLASE in the last 168 hours. ?No results for input(s): AMMONIA in the last 168 hours. ? ?ABG ?No results found for: PHART, PCO2ART, PO2ART, HCO3, TCO2, ACIDBASEDEF, O2SAT  ? ?Coagulation Profile: ?Recent Labs  ?Lab 10/22/21 ?1150  ?INR 1.0  ? ? ?Cardiac Enzymes: ?No results for input(s): CKTOTAL, CKMB, CKMBINDEX, TROPONINI in the last 168 hours. ? ?HbA1C: ?No results found for: HGBA1C ? ?CBG: ?Recent Labs  ?Lab 10/22/21 ?1121  ?GLUCAP 112*  ? ? ?Review of Systems: Positives in BOLD  ?Gen: Denies fever, chills, weight change, fatigue, night sweats ?HEENT: Denies blurred vision, double vision, hearing loss, tinnitus, sinus congestion, rhinorrhea, sore throat, neck stiffness, dysphagia ?PULM: Denies shortness of breath, cough, sputum production, hemoptysis, wheezing ?CV: Denies chest pain, edema, orthopnea, paroxysmal nocturnal dyspnea, palpitations ?GI: Denies abdominal pain, nausea, vomiting, diarrhea, hematochezia, melena, constipation, change in bowel habits ?GU: Denies dysuria, hematuria, polyuria, oliguria, urethral discharge ?Endocrine: Denies hot or cold intolerance, polyuria, polyphagia or appetite change ?Derm: Denies rash, dry skin, scaling or peeling skin change ?Heme: Denies easy bruising, bleeding, bleeding gums ?Neuro: headache, left upper extremity numbness/weakness, left finger curling, slurred speech, loss of memory or consciousness ? ? ?Past Medical History:  ?She,  has a past medical history of Anemia, Arthritis, Dyspnea, Gout involving toe, Hyperlipidemia, Hypertension, and Pre-diabetes.  ? ?Surgical History:  ? ?Past Surgical History:  ?Procedure Laterality Date  ? ABDOMINAL HYSTERECTOMY    ? COLONOSCOPY WITH PROPOFOL N/A 05/02/2015  ? Procedure: COLONOSCOPY WITH PROPOFOL;  Surgeon: Lollie Sails, MD;  Location[facility] END[facility]COPY;  Service: Endoscopy;  Laterality: N/A;  ? JOINT REPLACEMENT    ? right total hip  ?   ? ?Social History:  ? reports that she has never smoked. She has never used smokeless tobacco. She reports that she does not drink alcohol and does not use drugs.  ? ?Family History:  ?Her family history includes Breast cancer (age of onset: 36) in her sister; Diabetes in her sister.  ? ?Allergies ?Allergies  ?Allergen Reactions  ? Iodinated Contrast Media Hives and Itching  ?  I gave the patient 144m of isovue 300. As i was getting her up after the scan she was itching the Left side of her face. She had developed two hives on the left side of her face. Dr. SClovis Rileycame and evaluated her. We gave her '50mg'$  of Benadryl and her symptoms improved. SPM ?I gave the patient 1031mof isovue 300. As i was getting her up after the scan she was itching the Left side of her face. She had developed two hives on the left side of her face. Dr. StClovis Rileyame and evaluated her. We gave her '50mg'$  of Benadryl and her symptoms improved. SPM ?I gave the patient 1005mf isovue 300. As i was getting her up after the scan she was itching the Left side of her face. She had developed two hives on the left side of her face. Dr. StrClovis Rileyme and evaluated  her. We gave her '50mg'$  of Benadryl and her symptoms improved. SPM ? ?I gave the patient 123m of isovue 300. As i was getting her up after the scan she was itching the Left side of her face. She had developed two hives on the left side of her face. Dr. SClovis Rileycame and evaluated her. We gave her '50mg'$  of Benadryl and her symptoms improved. SPM ?I gave the patient 1092mof isovue 300. As i was getting her up after the scan she was itching the Left side of her face. She had developed two hives on the left side of her face. Dr. StClovis Rileyame and evaluated her. We gave her '50mg'$  of Benadryl and her symptoms improved. SPM ?I gave the patient 10035mf isovue 300. As i was getting her up after the scan she was itching the Left side of her face. She had developed two hives on the left side of her face. Dr.  StrClovis Rileyme and evaluated her. We gave her '50mg'$  of Benadryl and her symptoms improved. SPM ?  ? Metrizamide Hives and Itching  ?  I gave the patient 100m23m isovue 300. As i was getting her up after the scan she was i

## 2021-10-22 NOTE — Consult Note (Signed)
PHARMACY CONSULT NOTE - FOLLOW UP ? ?Pharmacy Consult for Electrolyte Monitoring and Replacement  ? ?Recent Labs: ?Potassium (mmol/L)  ?Date Value  ?10/22/2021 3.6  ?06/28/2012 4.5  ? ?Calcium (mg/dL)  ?Date Value  ?10/22/2021 9.8  ? ?Calcium, Total (mg/dL)  ?Date Value  ?06/28/2012 9.0  ? ?Albumin (g/dL)  ?Date Value  ?10/22/2021 4.2  ? ?Sodium (mmol/L)  ?Date Value  ?10/22/2021 139  ?06/28/2012 137  ? ? ? ?Assessment: ?88 y.o. female with past medical history of hypertension, hyperlipidemia, diabetes, and anemia who presents to the ED complaining of weakness w/u c/f acute lacunar stroke. Symptoms of Left arm numbness & weakness w/ LKW 0930, ultimately was administered TNK 4/03 12-noon admitted for observation and MRI. Pharmacy consulted for electrolyte mgmt.  ? ?Goal of Therapy:  ?Lytes WNL ? ?Plan:  ?All lytes WNL today, no repletion required at this time. ? ?Lorna Dibble, PharmD, BCCP ?Clinical Pharmacist ?10/22/2021 1:42 PM ? ? ?

## 2021-10-22 NOTE — ED Notes (Signed)
Activated code stroke w/[facility] '@11'$ :23 ?

## 2021-10-22 NOTE — ED Triage Notes (Addendum)
Pt comes with c/o left arm numbness and weakness. Pt states this started at 0930am. Pt states when she woke up this am she was fine. Pt states her hand felt weak and it was curling and she couldn't straighten it out. Pt tearful ? ?Pt denies any blurry vision.  ? ?Pt also states back on Saturday she did have something weird with her eyes. Pt states that resolved.  ? ?Pt has noticeable drift to left arm and states she feels weak when walking.  ?

## 2021-10-22 NOTE — Code Documentation (Signed)
Stroke Response Nurse Documentation ?Code Documentation ? ?Betty Atkins is a 88 y.o. female arriving to [facility] ED via [facility] on 4/3. On aspirin 81 mg daily. Code stroke was activated by Triage RN.  ? ?Patient from home where she was LKW at 0930 and now complaining of left arm/hand numbness. ? ?Stroke team at the bedside on patient arrival. Labs drawn and patient cleared for CT by Dr. Charna Archer. Patient to CT with team. NIHSS 2, see documentation for details and code stroke times. CT head completed. Patient initally thought to not be a candidate for IV Thrombolytic due to resolving sx's but with further assessment Dr Curly Shores is concerned for acute CVA and would like to proceed.  ?Care/Plan: Pt to by administered TNK, VS/NIH per protocol. Admit to ICU ? ?Bedside handoff with ED RN Bill.   ? ?Velta Addison ?Stroke Coordinator RN ?  ?

## 2021-10-22 NOTE — Consult Note (Signed)
CODE STROKE- PHARMACY COMMUNICATION ?Symptoms of Left arm numbness & weakness. LKW 0930. No contraindications to TNK noted in med review. Pt weight >100kg, ordered TNK 0.63m/kg maxed at 299mdose (57m54mand line NS flushed. CTM peripherally. ? ?Time CODE STROKE called/page received:1127 ? ?Time response to CODE STROKE was made in person: 1130 ? ?Time Stroke Kit retrieved from [facility] if needed):1155 ? ?Name of Provider/Nurse contacted: HeaNira ConnN (stroke coordinator) & Dr. BhaCurly Shoreseuro) ? ?Past Medical History:  ?Diagnosis Date  ? Anemia   ? Arthritis   ? degenerative  ? Dyspnea   ? Gout involving toe   ? Hyperlipidemia   ? Hypertension   ? Pre-diabetes   ? ?Prior to Admission medications   ?Medication Sig Start Date End Date Taking? Authorizing Provider  ?allopurinol (ZYLOPRIM) 100 MG tablet Take 100 mg by mouth daily. 05/29/17   [provider]  ?amLODipine-benazepril (LOTREL) 5-20 MG capsule Take 1 capsule by mouth daily.    [provider]  ?aspirin EC 81 MG tablet Take 162 mg by mouth daily.    [provider]  ?atorvastatin (LIPITOR) 20 MG tablet Take 40 mg by mouth daily.    [provider]  ?Calcium Carbonate-Vit D-Min (CALTRATE PLUS PO) Take 1 tablet by mouth daily.     [provider]  ?cetirizine (ZYRTEC) 10 MG tablet Take by mouth as needed.    [provider]  ?Ciclopirox 1 % shampoo APP ON THE SKIN UTD 04/28/18   [provider]  ?clobetasol ointment (TEMOVATE) 0.05 % Apply to affected area every night for 4 weeks, then every other day for 4 weeks and then twice a week for 4 weeks or until resolution. 11/16/19   Schuman, Christanna R, MD  ?COMBIGAN 0.2-0.5 % ophthalmic solution INT 1 GTT IN OU BID ?Patient not taking: Reported on 04/02/2021 01/27/18   [provider]  ?Cyanocobalamin (VITAMIN B-12) 5000 MCG SUBL Place 5,000 mcg under the tongue daily. ?Patient not taking: Reported on 04/02/2021    [provider]   ?hydrochlorothiazide (HYDRODIURIL) 25 MG tablet Take 25 mg by mouth daily.    [provider]  ?Multiple Vitamin (MULTIVITAMIN) tablet Take 1 tablet by mouth daily.    [provider]  ?timolol (TIMOPTIC) 0.5 % ophthalmic solution Apply to eye. 11/09/19   [provider]  ?Vitamin D3 (VITAMIN D) 25 MCG tablet Take 1,000 Units by mouth daily.    [provider]  ? ? ?BraLorna DibbleharmD ?Clinical Pharmacist  ?10/22/2021  12:11 PM ? ? ?

## 2021-10-22 NOTE — ED Notes (Signed)
Pure wick placed.

## 2021-10-22 NOTE — ED Notes (Signed)
Urine sent to lab 

## 2021-10-23 ENCOUNTER — Other Ambulatory Visit (INDEPENDENT_AMBULATORY_CARE_PROVIDER_SITE_OTHER): Payer: Self-pay | Admitting: Nurse Practitioner

## 2021-10-23 ENCOUNTER — Inpatient Hospital Stay: Payer: Medicare Other

## 2021-10-23 ENCOUNTER — Inpatient Hospital Stay
Admit: 2021-10-23 | Discharge: 2021-10-23 | Disposition: A | Discharge status: Home / Self Care | Payer: Medicare Other | Attending: Critical Care Medicine | Admitting: Critical Care Medicine

## 2021-10-23 DIAGNOSIS — M6281 Muscle weakness (generalized): Secondary | ICD-10-CM

## 2021-10-23 DIAGNOSIS — I6521 Occlusion and stenosis of right carotid artery: Secondary | ICD-10-CM

## 2021-10-23 LAB — BASIC METABOLIC PANEL
Anion gap: 9 (ref 5–15)
BUN: 13 mg/dL (ref 8–23)
CO2: 24 mmol/L (ref 22–32)
Calcium: 8.9 mg/dL (ref 8.9–10.3)
Chloride: 105 mmol/L (ref 98–111)
Creatinine, Ser: 1.02 mg/dL — ABNORMAL HIGH (ref 0.44–1.00)
GFR, Estimated: 54 mL/min — ABNORMAL LOW (ref >60)
Glucose, Bld: 126 mg/dL — ABNORMAL HIGH (ref 70–99)
Potassium: 3.4 mmol/L — ABNORMAL LOW (ref 3.5–5.1)
Sodium: 138 mmol/L (ref 135–145)

## 2021-10-23 LAB — MAGNESIUM: Magnesium: 1.7 mg/dL (ref 1.7–2.4)

## 2021-10-23 LAB — PHOSPHORUS: Phosphorus: 3.3 mg/dL (ref 2.5–4.6)

## 2021-10-23 LAB — GLUCOSE, CAPILLARY
Glucose-Capillary: 132 mg/dL — ABNORMAL HIGH (ref 70–99)
Glucose-Capillary: 147 mg/dL — ABNORMAL HIGH (ref 70–99)

## 2021-10-23 LAB — CBC
HCT: 40.1 % (ref 36.0–46.0)
Hemoglobin: 13.3 g/dL (ref 12.0–15.0)
MCH: 29.1 pg (ref 26.0–34.0)
MCHC: 33.2 g/dL (ref 30.0–36.0)
MCV: 87.7 fL (ref 80.0–100.0)
Platelets: 260 10*3/uL (ref 150–400)
RBC: 4.57 MIL/uL (ref 3.87–5.11)
RDW: 13 % (ref 11.5–15.5)
WBC: 8.4 10*3/uL (ref 4.0–10.5)
nRBC: 0 % (ref 0.0–0.2)

## 2021-10-23 LAB — HEMOGLOBIN A1C
Hgb A1c MFr Bld: 6.6 % — ABNORMAL HIGH (ref 4.8–5.6)
Mean Plasma Glucose: 143 mg/dL

## 2021-10-23 MED ORDER — CLOPIDOGREL BISULFATE 75 MG PO TABS: 75.0000 mg | ORAL_TABLET | Freq: Every day | ORAL | 2 refills | Status: DC

## 2021-10-23 MED ORDER — INSULIN ASPART 100 UNIT/ML IJ SOLN: 0.0000 [IU] | Freq: Every day | INTRAMUSCULAR | Status: DC

## 2021-10-23 MED ORDER — CLOPIDOGREL BISULFATE 75 MG PO TABS
300.0000 mg | ORAL_TABLET | Freq: Once | ORAL | Status: AC
  Administered 2021-10-23: 300 mg via ORAL
  Filled 2021-10-23: qty 4

## 2021-10-23 MED ORDER — MAGNESIUM SULFATE 2 GM/50ML IV SOLN
2.0000 g | Freq: Once | INTRAVENOUS | Status: AC
  Administered 2021-10-23: 2 g via INTRAVENOUS
  Filled 2021-10-23: qty 50

## 2021-10-23 MED ORDER — ATORVASTATIN CALCIUM 10 MG PO TABS: 10.0000 mg | ORAL_TABLET | Freq: Every day | ORAL | 0 refills | Status: DC

## 2021-10-23 MED ORDER — DOCUSATE SODIUM 100 MG PO CAPS
100.0000 mg | ORAL_CAPSULE | Freq: Two times a day (BID) | ORAL | 0 refills | Status: AC | PRN
Start: 2021-10-23 — End: ?

## 2021-10-23 MED ORDER — ASPIRIN EC 81 MG PO TBEC
81.0000 mg | DELAYED_RELEASE_TABLET | Freq: Every day | ORAL | Status: DC
  Administered 2021-10-23 – 2021-10-24 (×2): 81 mg via ORAL
  Filled 2021-10-23 (×2): qty 1

## 2021-10-23 MED ORDER — ASPIRIN 81 MG PO TBEC: 81.0000 mg | DELAYED_RELEASE_TABLET | Freq: Every day | ORAL | 11 refills | Status: AC

## 2021-10-23 MED ORDER — BENAZEPRIL HCL 20 MG PO TABS
20.0000 mg | ORAL_TABLET | Freq: Every day | ORAL | Status: DC
  Administered 2021-10-24: 20 mg via ORAL
  Filled 2021-10-23 (×2): qty 1

## 2021-10-23 MED ORDER — POTASSIUM CHLORIDE 20 MEQ PO PACK
40.0000 meq | PACK | Freq: Once | ORAL | Status: AC
  Administered 2021-10-23: 40 meq via ORAL
  Filled 2021-10-23: qty 2

## 2021-10-23 MED ORDER — ATORVASTATIN CALCIUM 20 MG PO TABS: 10.0000 mg | ORAL_TABLET | Freq: Every day | ORAL | Status: DC

## 2021-10-23 MED ORDER — AMLODIPINE BESYLATE 5 MG PO TABS
5.0000 mg | ORAL_TABLET | Freq: Every day | ORAL | Status: DC
  Administered 2021-10-23 – 2021-10-24 (×2): 5 mg via ORAL
  Filled 2021-10-23 (×3): qty 1

## 2021-10-23 MED ORDER — INSULIN ASPART 100 UNIT/ML IJ SOLN
0.0000 [IU] | Freq: Three times a day (TID) | INTRAMUSCULAR | Status: DC
  Administered 2021-10-24: 2 [IU] via SUBCUTANEOUS
  Filled 2021-10-23 (×2): qty 1

## 2021-10-23 MED ORDER — AMLODIPINE BESY-BENAZEPRIL HCL 5-20 MG PO CAPS
1.0000 | ORAL_CAPSULE | Freq: Every day | ORAL | Status: DC
Start: 2021-10-23 — End: 2021-10-23

## 2021-10-23 MED ORDER — CLOPIDOGREL BISULFATE 75 MG PO TABS
75.0000 mg | ORAL_TABLET | Freq: Every day | ORAL | Status: DC
  Administered 2021-10-24: 75 mg via ORAL
  Filled 2021-10-23: qty 1

## 2021-10-23 NOTE — Discharge Summary (Signed)
Physician Discharge Summary  ?Patient ID: ?Betty Atkins ?MRN: [id number] ?DOB/AGE: 22-Jun-1933 88 y.o. ? ?Admit date: 10/22/2021 ?Discharge date: 10/24/2021 ? ? ?Brief Pt Description / Synopsis:  ?88 y.o. Female with a past medical history of Anemia, Arthritis, Dyspnea, Gout involving toe, Hyperlipidemia, Hypertension, and Pre-diabetes admitted 10/22/21 with Acute Ischemic CVA s/p TNK.  Found to have severe atherosclerotic stenosis (>80%) of the proximal right internal carotid artery.  Vascular Surgery consulted. ? ? ?Discharge Diagnoses:  ? ?Acute Ischemic CVA ?Carotid Artery Stenosis (right) ?Hypokalemia ~ Resolved ?                                                ?       ?Discharge Summary:  ?This is an 88 yo female who presented to [facility] ER on 04/3 with left hand weakness/numbness and curling of the left fingers that lasted about 15 to 20 minutes than subsided spontaneously.  However, she had a recurrent episode of the curling of the left fingers that lasted 45 minutes prompting ER visit.  Onset of symptoms were at 09:30 am on 04/3. She also reported on 04/1 she had visual changes resulting in seeing dots that lasted 5 minutes which resolved spontaneously. ?  ?ED Course ?Upon arrival to the ER per ER providers note the curling of the left fingers had resolved, however pt continued to have mild weakness of the left upper extremity.  Code stroke activated.  CT Head negative for acute intracranial abnormality, however revealed mild to moderate chronic small vessel ischemic changes within the cerebral white matter.  Neurology evaluated pt with recommendation to proceed with TNK administration.  ER lab results were unremarkable and ER vital signs were stable.  PCCM team contacted for ICU admission post TNK.  ? ? ?Discharge Plan by Diagnosis:  ? ?Left upper extremity weakness/numbness/finger curling in setting of Acute Ischemic CVA, s/p TNK administration  ?Right Carotid Artery Stenosis ?-MRI Brain 10/22/21 with "2  subcentimeter acute infarcts within mid-to-posterior right frontal lobe, within the right ACA territory or ACA/MCA watershed territory. Additional subcentimeter acute infarct within the right parietal lobe, in the right MCA/PCA watershed territory. ?Background moderate chronic small vessel ischemic changes within ?the cerebral white matter and pons, slightly progressed from the ?prior brain MRI of 12/15/2006." ?-MRA Head & Neck was negative for intracranial large vessel occlusion, did show severe stenosis (>80%) of the proximal right internal carotid artery ~ Vascular Surgery consulted, recommends outpatient follow up in 1-2 weeks for likely Carotid Stenting ?-Resume home Lotrel & HCTZ to maintain GOAL BP <140/90 ?-Lipitor increased to 80 mg daily as per Neurology (Fasting lipid panel on 40 mg Lipitor: Total cholesterol 183, HDL 51, LDL 90, Triglycerides 211) ?-Hgb A1c 6.6 ~ pt has refused insulin while in hospital ~ will defer to PCP for outpatient Diabetes management ?-Dual Antiplatelet therapy with Aspirin + Plavix for minimum of 90 days ?-Echocardiogram completed, report pending  ?-Follow up with Neurology outpatient in 2-4 weeks ? ?Mild Hypokalemia ~ RESOLVED ?-Follow up BMP as outpatient ? ? ? ?Significant Events:  ?4/3: Presented to ED, evaluated by Neurology, received TNK.  PCCM asked to admit ?4/4: Stroke workup in progress (Echo/PT/OT).  Vascular surgery consulted for evaluation of right internal carotid artery stenosis.  [place] for discharge per Neurology. ?04/05: Echo performed this morning, Once resulted, will plan for discharge home with  PT.               ? ?Significant Diagnostic Studies:  ?4/3: CT head without contrast: IMPRESSION: ?No evidence of acute intracranial abnormality.  ?Mild-to-moderate chronic small vessel ischemic changes within the ?cerebral white matter. ?4/3: MRI brain: ?1. Two subcentimeter acute infarcts within the mid-to-posterior ?right frontal lobe, within the right ACA territory or  ACA/MCA ?watershed territory. ?2. Additional subcentimeter acute infarct within the right parietal ?lobe, in the right MCA/PCA watershed territory. ?3. Background moderate chronic small vessel ischemic changes within ?the cerebral white matter and pons, slightly progressed from the ?prior brain MRI of 12/15/2006. ?4/3: MRA head: ?1. No intracranial large vessel occlusion or proximal high-grade ?arterial stenosis. ?2. Mild/moderate atherosclerotic narrowing of the non-dominant V4 ?right vertebral artery, distal to the PICA origin. ?3. Left PCA distal branch atherosclerotic irregularity. ?4/3: MRA neck: ?1. The common carotid, internal carotid and vertebral arteries are ?patent within the neck. ?2. Severe atherosclerotic stenosis of the proximal right internal ?carotid artery (greater than 80%). ?3. Apparent moderate/severe stenosis within the proximal V1 right ?vertebral artery. ?4/4: Echocardiogram: ?4/4: Repeat CT Head w/o contrast: FINDINGS: ?Brain: Acute to subacute infarcts by MRI are largely occult by CT. ?No hemorrhage, hydrocephalus, or masslike finding. No evidence of ?interval gray matter infarction. Chronic small vessel ischemia in ?the hemispheric white matter. Normal brain volume for age ?Vascular: No hyperdense vessel or unexpected calcification. ?Skull: Normal. Negative for fracture or focal lesion. ?Sinuses/Orbits: No acute finding. ?IMPRESSION: ?Known acute infarcts by MRI.  No hemorrhage or visible progression. ?          ? ?Micro Data:  ?4/3: MRSA PCR>>negative ? ?Antimicrobials:  ?N/A ? ?Consults:  ?PCCM ?Neurology ?Vascular Surgery ? ?Discharge Exam:  ?General: Elderly female resting in bed, NAD on RA ?HENT: Supple, no JVD  ?Lungs: Clear throughout, even, non labored  ?Cardiovascular: NSR, rrr, no m/r/g, 2+ radial/2+ distal pulses, no edema  ?Abdomen: +BS x4, non distended, non tender, obese ?Extremities: Normal bulk and tone, moves all extremities  ?Neuro: Alert and oriented, follows command,  5/5 BUE/BLE motor strength, no BLE/BUE drift  ?GU: Deferred  ? ?Vitals:  ? 10/24/21 0300 10/24/21 0416 10/24/21 0500 10/24/21 0821  ?BP: (!) 156/62 (!) 157/54  133/72  ?Pulse: 98 (!) 110  99  ?Resp: 17   18  ?Temp:  98.7 ?F (37.1 ?C)  98.4 ?F (36.9 ?C)  ?TempSrc:  Oral    ?SpO2: 92% 98%  95%  ?Weight:   105.2 kg   ?Height:      ? ? ? ?Discharge Labs:  ? ?BMET ?Recent Labs  ?Lab 10/22/21 ?1150 10/23/21 ?0454 10/24/21 ?8016  ?NA 139 138 138  ?K 3.6 3.4* 3.6  ?CL 105 105 105  ?CO2 '27 24 24  '$ ?GLUCOSE 106* 126* 149*  ?BUN '15 13 13  '$ ?CREATININE 1.06* 1.02* 1.07*  ?CALCIUM 9.8 8.9 9.3  ?MG  --  1.7 2.2  ?PHOS  --  3.3  --   ? ? ?CBC ?Recent Labs  ?Lab 10/22/21 ?1150 10/23/21 ?0454 10/24/21 ?5537  ?HGB 14.2 13.3 13.7  ?HCT 43.2 40.1 41.0  ?WBC 7.1 8.4 7.5  ?PLT 280 260 272  ? ? ?Anti-Coagulation ?Recent Labs  ?Lab 10/22/21 ?1150  ?INR 1.0  ? ? ? ? ? ? Follow-up Information   ? ? Leonel Ramsay, MD Follow up on 10/29/2021.   ?Specialty: Infectious Diseases ?Why: appt: DR Ola Spurr ON MONDAY APRIL 10 AT 09:45AM ?Contact information: ?[address] ?  [place] [address] ?[phone] ? ? ?  ?  ? ? Anabel Bene, MD Follow up on 11/29/2021.   ?Specialty: Neurology ?Why: APPT DR Melrose Nakayama, MAY 11,2023 AT 1:30PM ?Contact information: ?[address] [facility] ?[place] [address] ?[phone] ? ? ?  ?  ? ?  ?  ? ?  ? ? ? ?Allergies as of 10/24/2021   ? ?   Reactions  ? Iodinated Contrast Media Hives, Itching  ? I gave the patient 137m of isovue 300. As i was getting her up after the scan she was itching the Left side of her face. She had developed two hives on the left side of her face. Dr. SClovis Rileycame and evaluated her. We gave her '50mg'$  of Benadryl and her symptoms improved. SPM ?I gave the patient 1025mof isovue 300. As i was getting her up after the scan she was itching the Left side of her face. She had developed two hives on the left side of her face. Dr. StClovis Rileyame and evaluated  her. We gave her '50mg'$  of Benadryl and her symptoms improved. SPM ?I gave the patient 10075mf isovue 300. As i was getting her up after the scan she was itching the Left side of her face. She had developed two hi

## 2021-10-23 NOTE — Consult Note (Signed)
?[facility] ?Vascular Consult Note ? ?MRN : [id number] ? ?Betty Atkins is a 88 y.o. (02/15/1933) female who presents with chief complaint of  ?Chief Complaint  ?Patient presents with  ? Weakness  ? Numbness  ?Marland Kitchen ?Reason for consult: Right internal carotid artery stenosis detected by MRA ? ?Consulting physician: Darel Hong, NP ?History of Present Illness: Betty Atkins is a 88 year old female that presented to [facility] on 11/18/2021 due to left upper extremity weakness and numbness.  She notes that approximately 2 days before that she had some visual disturbances that only last for several minutes.  1 day prior she also notes that she has a weakness that was only momentary.  Upon presentation to the emergency room the patient underwent evaluation which found an acute ischemic stroke in the right frontal lobe based on MRI.  An MRA of the head and neck showed a greater than 85% proximal right internal carotid artery stenosis.  The patient also has a previous medical history notable for hyperlipidemia and hypertension with prediabetes.  The patient also underwent TNKase administration and notes that she is essentially back to her baseline levels. ? ?Current Facility-Administered Medications  ?Medication Dose Route Frequency Provider Last Rate Last Admin  ? acetaminophen (TYLENOL) tablet 650 mg  650 mg Oral Q4H PRN Teressa Lower, NP      ? allopurinol (ZYLOPRIM) tablet 100 mg  100 mg Oral Daily Teressa Lower, NP   100 mg at 10/23/21 0900  ? amLODipine (NORVASC) tablet 5 mg  5 mg Oral Daily Dorothe Pea, RPH   5 mg at 10/23/21 1853  ? And  ? benazepril (LOTENSIN) tablet 20 mg  20 mg Oral Daily Dorothe Pea, RPH      ? aspirin EC tablet 81 mg  81 mg Oral Daily Bhagat, Srishti L, MD   81 mg at 10/23/21 1439  ? [START ON 10/24/2021] atorvastatin (LIPITOR) tablet 10 mg  10 mg Oral QHS Bhagat, Srishti L, MD      ? Chlorhexidine Gluconate Cloth 2 % PADS 6 each  6  each Topical Q0600 Maryjane Hurter, MD   6 each at 10/23/21 0800  ? cholecalciferol (VITAMIN D3) tablet 1,000 Units  1,000 Units Oral Daily Teressa Lower, NP   1,000 Units at 10/23/21 0900  ? [START ON 10/24/2021] clopidogrel (PLAVIX) tablet 75 mg  75 mg Oral Daily Bhagat, Srishti L, MD      ? docusate sodium (COLACE) capsule 100 mg  100 mg Oral BID PRN Teressa Lower, NP      ? insulin aspart (novoLOG) injection 0-15 Units  0-15 Units Subcutaneous TID WC Darel Hong D, NP      ? insulin aspart (novoLOG) injection 0-5 Units  0-5 Units Subcutaneous QHS Darel Hong D, NP      ? loratadine (CLARITIN) tablet 10 mg  10 mg Oral Daily PRN Teressa Lower, NP      ? multivitamin with minerals tablet 1 tablet  1 tablet Oral Daily Teressa Lower, NP   1 tablet at 10/23/21 0900  ? ondansetron (ZOFRAN) injection 4 mg  4 mg Intravenous Q6H PRN Teressa Lower, NP      ? polyethylene glycol (MIRALAX / GLYCOLAX) packet 17 g  17 g Oral Daily PRN Teressa Lower, NP      ? ? ?Past Medical History:  ?Diagnosis Date  ? Anemia   ? Arthritis   ? degenerative  ?  Dyspnea   ? Gout involving toe   ? Hyperlipidemia   ? Hypertension   ? Pre-diabetes   ? ? ?Past Surgical History:  ?Procedure Laterality Date  ? ABDOMINAL HYSTERECTOMY    ? COLONOSCOPY WITH PROPOFOL N/A 05/02/2015  ? Procedure: COLONOSCOPY WITH PROPOFOL;  Surgeon: Lollie Sails, MD;  Location: [facility] ENDOSCOPY;  Service: Endoscopy;  Laterality: N/A;  ? JOINT REPLACEMENT    ? right total hip  ? ? ?Social History ?Social History  ? ?Tobacco Use  ? Smoking status: Never  ? Smokeless tobacco: Never  ?Vaping Use  ? Vaping Use: Never used  ?Substance Use Topics  ? Alcohol use: No  ? Drug use: No  ? ? ?Family History ?Family History  ?Problem Relation Age of Onset  ? Breast cancer Sister 83  ? Diabetes Sister   ? ? ?Allergies  ?Allergen Reactions  ? Iodinated Contrast Media Hives and Itching  ?  I gave the patient 133m of isovue 300. As i was getting her up after the scan she  was itching the Left side of her face. She had developed two hives on the left side of her face. Dr. SClovis Rileycame and evaluated her. We gave her '50mg'$  of Benadryl and her symptoms improved. SPM ?I gave the patient 1077mof isovue 300. As i was getting her up after the scan she was itching the Left side of her face. She had developed two hives on the left side of her face. Dr. StClovis Rileyame and evaluated her. We gave her '50mg'$  of Benadryl and her symptoms improved. SPM ?I gave the patient 10089mf isovue 300. As i was getting her up after the scan she was itching the Left side of her face. She had developed two hives on the left side of her face. Dr. StrClovis Rileyme and evaluated her. We gave her '50mg'$  of Benadryl and her symptoms improved. SPM ? ?I gave the patient 100m107m isovue 300. As i was getting her up after the scan she was itching the Left side of her face. She had developed two hives on the left side of her face. Dr. StroClovis Rileye and evaluated her. We gave her '50mg'$  of Benadryl and her symptoms improved. SPM ?I gave the patient 100ml27misovue 300. As i was getting her up after the scan she was itching the Left side of her face. She had developed two hives on the left side of her face. Dr. StrouClovis Riley and evaluated her. We gave her '50mg'$  of Benadryl and her symptoms improved. SPM ?I gave the patient 100ml 57msovue 300. As i was getting her up after the scan she was itching the Left side of her face. She had developed two hives on the left side of her face. Dr. StroudClovis Rileyand evaluated her. We gave her '50mg'$  of Benadryl and her symptoms improved. SPM ?  ? Metrizamide Hives and Itching  ?  I gave the patient 100ml o48movue 300. As i was getting her up after the scan she was itching the Left side of her face. She had developed two hives on the left side of her face. Dr. Stroud Clovis Rileynd evaluated her. We gave her '50mg'$  of Benadryl and her symptoms improved. SPM  ? ? ? ?REVIEW OF SYSTEMS (Negative unless  checked) ? ?Constitutional: '[]'$ Weight loss  '[]'$ Fever  '[]'$ Chills ?Cardiac: '[]'$ Chest pain   '[]'$ Chest pressure   '[]'$ Palpitations   '[]'$ Shortness of breath when laying flat   '[]'$ Shortness of  breath at rest   '[]'$ Shortness of breath with exertion. ?Vascular:  '[]'$ Pain in legs with walking   '[]'$ Pain in legs at rest   '[]'$ Pain in legs when laying flat   '[]'$ Claudication   '[]'$ Pain in feet when walking  '[]'$ Pain in feet at rest  '[]'$ Pain in feet when laying flat   '[]'$ History of DVT   '[]'$ Phlebitis   '[]'$ Swelling in legs   '[]'$ Varicose veins   '[]'$ Non-healing ulcers ?Pulmonary:   '[]'$ Uses home oxygen   '[]'$ Productive cough   '[]'$ Hemoptysis   '[]'$ Wheeze  '[]'$ COPD   '[]'$ Asthma ?Neurologic:  '[]'$ Dizziness  '[]'$ Blackouts   '[]'$ Seizures   '[x]'$ History of stroke   '[]'$ History of TIA  '[]'$ Aphasia   '[]'$ Temporary blindness   '[]'$ Dysphagia   '[x]'$ Weakness or numbness in arms   '[]'$ Weakness or numbness in legs ?Musculoskeletal:  '[]'$ Arthritis   '[]'$ Joint swelling   '[]'$ Joint pain   '[]'$ Low back pain ?Hematologic:  '[]'$ Easy bruising  '[]'$ Easy bleeding   '[]'$ Hypercoagulable state   '[]'$ Anemic  '[]'$ Hepatitis ?Gastrointestinal:  '[]'$ Blood in stool   '[]'$ Vomiting blood  '[]'$ Gastroesophageal reflux/heartburn   '[]'$ Difficulty swallowing. ?Genitourinary:  '[]'$ Chronic kidney disease   '[]'$ Difficult urination  '[]'$ Frequent urination  '[]'$ Burning with urination   '[]'$ Blood in urine ?Skin:  '[]'$ Rashes   '[]'$ Ulcers   '[]'$ Wounds ?Psychological:  '[]'$ History of anxiety   '[]'$  History of major depression. ? ?Physical Examination ? ?Vitals:  ? 10/23/21 2100 10/23/21 2154 10/23/21 2200 10/23/21 2300  ?BP: (!) 146/59  (!) 149/65 (!) 141/59  ?Pulse: 86 92 90 85  ?Resp: '13 14 18 16  '$ ?Temp:      ?TempSrc:      ?SpO2: (!) 89% 96% 94% 92%  ?Weight:      ?Height:      ? ?Body mass index is 35.3 kg/m?. ?Gen:  WD/WN, NAD ?Head: [place]/AT, No temporalis wasting. Prominent temp pulse not noted. ?Ear/Nose/Throat: Hearing grossly intact, nares w/o erythema or drainage, oropharynx w/o Erythema/Exudate ?Eyes: Sclera non-icteric, conjunctiva clear ?Neck: Trachea midline.  No JVD.   ?Pulmonary:  Good air movement, respirations not labored, equal bilaterally.  ?Cardiac: RRR, normal S1, S2. ?Vascular:  ?Vessel Right Left  ?PT Palpable Palpable  ?DP Palpable Palpable  ? ?Gastrointestinal: soft, non

## 2021-10-23 NOTE — Consult Note (Signed)
PHARMACY CONSULT NOTE - FOLLOW UP ? ?Pharmacy Consult for Electrolyte Monitoring and Replacement  ? ?Recent Labs: ?Potassium (mmol/L)  ?Date Value  ?10/23/2021 3.4 (L)  ?06/28/2012 4.5  ? ?Magnesium (mg/dL)  ?Date Value  ?10/23/2021 1.7  ? ?Calcium (mg/dL)  ?Date Value  ?10/23/2021 8.9  ? ?Calcium, Total (mg/dL)  ?Date Value  ?06/28/2012 9.0  ? ?Albumin (g/dL)  ?Date Value  ?10/22/2021 4.2  ? ?Phosphorus (mg/dL)  ?Date Value  ?10/23/2021 3.3  ? ?Sodium (mmol/L)  ?Date Value  ?10/23/2021 138  ?06/28/2012 137  ? ? ? ?Assessment: ?88 y.o. female with past medical history of hypertension, hyperlipidemia, pre-diabetes, and anemia who presents to the ED complaining of weakness w/u c/f acute lacunar stroke. Symptoms of left arm numbness & weakness w/ LKW 0930, ultimately was administered TNK 4/03 12-noon. Admitted with acute ischemic stroke. Pharmacy consulted for electrolyte mgmt.  ? ?MRI of brain: two subcentimeter acute infarcts within the mid-to-posterior right frontal lobe. Additional subcentimeter acute infarct within the right parietal lobe. ? ?Estimate baseline Scr 1.1.  ? ?Goal of Therapy:  ?Lytes WNL ? ?Plan:  ?K low at 3.4. Give Kcl 40 mEq x 1.  ?Mag low at 1.7. Give Magnesium sulfate 2 grams x 1. ?Follow up labs AM. ? ? ?Wynelle Cleveland, PharmD ?Pharmacy Resident  ?10/23/2021 ?7:09 AM ? ?

## 2021-10-23 NOTE — Evaluation (Signed)
Physical Therapy Evaluation ?Patient Details ?Name: Betty Atkins ?MRN: [id number] ?DOB: November 07, 1932 ?Today's Date: 10/23/2021 ? ?History of Present Illness ? 88 yo female who presented to [facility] ER on 04/3 with left hand weakness/numbness and curling of the left fingers that lasted about 15 to 20 minutes than subsided spontaneously.  However, she had a recurrent episode of the curling of the left fingers that lasted 45 minutes prompting ER.  Found to have had CVA, treated with TNK.  ?Clinical Impression ? Pt very pleasant t/o the PT exam and was eager to get up and see what she can do.  She did have some L UE weakness per R, however she reports being able to use it normally during meals and object manipulation.  She was ultimately able to do a prolonged bout of ambulation (>150 ft) with walker and then ~51f with minimal single UE use.  Her HR stayed ~130bpm t/o the effort with minimal c/o fatigue, SpO2 remains high90s on room air.  Pt reports that she normally is able to stay active w/o AD, agrees she should use one initially when she returns home and also agrees that a bout of HHPT will benefit her with transitioning back to her normally independent lifestyle.     ?   ? ?Recommendations for follow up therapy are one component of a multi-disciplinary discharge planning process, led by the attending physician.  Recommendations may be updated based on patient status, additional functional criteria and insurance authorization. ? ?Follow Up Recommendations Home health PT ? ?  ?Assistance Recommended at Discharge Set up Supervision/Assistance  ?Patient can return home with the following ? Assistance with cooking/housework;Help with stairs or ramp for entrance ? ?  ?Equipment Recommendations None recommended by PT  ?Recommendations for Other Services ?    ?  ?Functional Status Assessment Patient has had a recent decline in their functional status and demonstrates the ability to make significant improvements in function in  a reasonable and predictable amount of time.  ? ?  ?Precautions / Restrictions Precautions ?Precautions: Fall ?Restrictions ?Weight Bearing Restrictions: No  ? ?  ? ?Mobility ? Bed Mobility ?Overal bed mobility: Independent ?  ?  ?  ?  ?  ?  ?General bed mobility comments: Pt able to gethself up to sitting EOB w/o issue ?  ? ?Transfers ?Overall transfer level: Modified independent ?Equipment used: Rolling walker (2 wheels) ?  ?  ?  ?  ?  ?  ?  ?General transfer comment: Pt rose to standing with minimal UE reliance and no hesitation.  Good balance and sa ?  ? ?Ambulation/Gait ?Ambulation/Gait assistance: Supervision ?Gait Distance (Feet): 175 Feet ?Assistive device: Rolling walker (2 wheels), 1 person hand held assist ?  ?  ?  ?  ?General Gait Details: Pt with slow and deliberate gait, she was not overly reliant on the walker but did show more confidence with b/l UEs supported than with just HHA or rail in hallway.  Pt's HR rose quickly to ~130 with ambulation and stayed 127-135bpm the entire time, back to low 100s quickly after ambulation.  Pt with no overt LOBs or safety issues, able to hold conversation and maintain high 90s SpO2 and though she is not at her normal AD-free baseline she moved well ? ?Stairs ?  ?  ?  ?  ?  ? ?Wheelchair Mobility ?  ? ?Modified Rankin (Stroke Patients Only) ?  ? ?  ? ?Balance Overall balance assessment: Needs assistance ?Sitting-balance support: No upper  extremity supported ?Sitting balance-Leahy Scale: Normal ?  ?  ?Standing balance support: Bilateral upper extremity supported ?Standing balance-Leahy Scale: Good ?Standing balance comment: typically does not need AD, definitely more steady with some UE support today ?  ?  ?  ?  ?  ?  ?  ?  ?  ?  ?  ?   ? ? ? ?Pertinent Vitals/Pain Pain Assessment ?Pain Assessment: No/denies pain  ? ? ?Home Living Family/patient expects to be discharged to:: Private residence ?Living Arrangements: Children ?Available Help at Discharge: Available  PRN/intermittently (daughter out of home during work hours, another daughter lives close and is available) ?  ?Home Access: Stairs to enter ?Entrance Stairs-Rails: None (has 6 steps with rail at alternative enterance) ?Entrance Stairs-Number of Steps: 2 ?  ?  ?Home Equipment: Conservation officer, nature (2 wheels);Cane - single point ?   ?  ?Prior Function Prior Level of Function : Independent/Modified Independent ?  ?  ?  ?  ?  ?  ?Mobility Comments: Pt does not use AD, drives/out in community a few times/week ?  ?  ? ? ?Hand Dominance  ?   ? ?  ?Extremity/Trunk Assessment  ? Upper Extremity Assessment ?Upper Extremity Assessment: Overall WFL for tasks assessed (L UE grip, tricep weaker than R (she is right handed) reports L UE seems to be essentially back to baseline) ?  ? ?Lower Extremity Assessment ?Lower Extremity Assessment: Overall WFL for tasks assessed ?  ? ?   ?Communication  ? Communication: No difficulties  ?Cognition Arousal/Alertness: Awake/alert ?Behavior During Therapy: [facility] for tasks assessed/performed ?Overall Cognitive Status: Within Functional Limits for tasks assessed ?  ?  ?  ?  ?  ?  ?  ?  ?  ?  ?  ?  ?  ?  ?  ?  ?  ?  ?  ? ?  ?General Comments   ? ?  ?Exercises    ? ?Assessment/Plan  ?  ?PT Assessment Patient needs continued PT services  ?PT Problem List Decreased activity tolerance;Decreased balance;Decreased knowledge of use of DME;Decreased safety awareness;Cardiopulmonary status limiting activity ? ?   ?  ?PT Treatment Interventions DME instruction;Gait training;Stair training;Functional mobility training;Therapeutic activities;Therapeutic exercise;Balance training;Neuromuscular re-education;Patient/family education   ? ?PT Goals (Current goals can be found in the Care Plan section)  ?Acute Rehab PT Goals ?Patient Stated Goal: go home ?PT Goal Formulation: With patient ?Time For Goal Achievement: 11/06/21 ?Potential to Achieve Goals: Good ? ?  ?Frequency Min 2X/week ?  ? ? ?Co-evaluation   ?  ?  ?  ?   ? ? ?  ?AM-PAC PT "6 Clicks" Mobility  ?Outcome Measure Help needed turning from your back to your side while in a flat bed without using bedrails?: A Little ?Help needed moving from lying on your back to sitting on the side of a flat bed without using bedrails?: A Little ?Help needed moving to and from a bed to a chair (including a wheelchair)?: A Little ?Help needed standing up from a chair using your arms (e.g., wheelchair or bedside chair)?: A Little ?Help needed to walk in hospital room?: A Little ?Help needed climbing 3-5 steps with a railing? : A Little ?6 Click Score: 18 ? ?  ?End of Session Equipment Utilized During Treatment: Gait belt ?Activity Tolerance: Patient tolerated treatment well ?Patient left: in chair;with call bell/phone within reach;with nursing/sitter in room;with family/visitor present ?Nurse Communication: Mobility status ?PT Visit Diagnosis: Muscle weakness (generalized) (M62.81);Difficulty in walking,  not elsewhere classified (R26.2) ?  ? ?Time: 7035-0093 ?PT Time Calculation (min) (ACUTE ONLY): 30 min ? ? ?Charges:   PT Evaluation ?$PT Eval Low Complexity: 1 Low ?PT Treatments ?$Gait Training: 8-22 mins ?  ?   ? ? ?Kreg Shropshire, DPT ?10/23/2021, 3:47 PM ?

## 2021-10-23 NOTE — Progress Notes (Signed)
Neurology Progress Note ? ?Patient ID: Betty Atkins is a 88 y.o. with PMHx of  has a past medical history of Anemia, Arthritis, Dyspnea, Gout involving toe, Hyperlipidemia, Hypertension, and Pre-diabetes. ? ?Subjective: ?Feels essentially back to baseline ? ?Exam: ?Vitals:  ? 10/23/21 1000 10/23/21 1113  ?BP: 134/64 (!) 145/65  ?Pulse: 81 85  ?Resp: 13 16  ?Temp:  97.7 ?F (36.5 ?C)  ?SpO2: 98% 98%  ? ?Gen: In bed, comfortable  ?Resp: non-labored breathing, no grossly audible wheezing ?Cardiac: Perfusing extremities well  ?Abd: soft, nt ? ?Neuro: ?MS: Alert awake, oriented, pleasant ?CN: Mild RNLF, PERRL, EOMI  ?Motor: 5/5 throughout ?Sensory: Intact throughout  ? ?NIH 0 today ? ?Pertinent Data: ? ? ?Lab Results  ?Component Value Date  ? CHOL 183 10/22/2021  ? HDL 51 10/22/2021  ? [place] 90 10/22/2021  ? TRIG 211 (H) 10/22/2021  ? CHOLHDL 3.6 10/22/2021  ? ?Lab Results  ?Component Value Date  ? HGBA1C 6.6 (H) 10/22/2021  ?  ?All imaging personally reviewed, agree with radiology: ? ?MRI brain: ?  ?1. Two subcentimeter acute infarcts within the mid-to-posterior ?right frontal lobe, within the right ACA territory or ACA/MCA ?watershed territory. ?2. Additional subcentimeter acute infarct within the right parietal ?lobe, in the right MCA/PCA watershed territory. ?3. Background moderate chronic small vessel ischemic changes within ?the cerebral white matter and pons, slightly progressed from the ?prior brain MRI of 12/15/2006. ?  ?MRA head: ?  ?1. No intracranial large vessel occlusion or proximal high-grade ?arterial stenosis. ?2. Mild/moderate atherosclerotic narrowing of the non-dominant V4 ?right vertebral artery, distal to the PICA origin. ?3. Left PCA distal branch atherosclerotic irregularity. ?  ?MRA neck: ?  ?1. The common carotid, internal carotid and vertebral arteries are ?patent within the neck. ?2. Severe atherosclerotic stenosis of the proximal right internal ?carotid artery (greater than 80%). ?3.  Apparent moderate/severe stenosis within the proximal V1 right ?vertebral artery. ? ?Impression: Likely symptomatic right carotid stenosis, appreciate vascular eval. Medical optimization as below. ECHO to complete workup but low concern for cardiac process at this time.  ? ?Recommendations: ?-Vascular neurology consultation ?-Atorvastatin 10 mg nightly to achieve goal LDL less than 70 ?-Head CT, if this is stable we will start aspirin and Plavix dual antiplatelet therapy for minimum of 90 days, if patient has vascular intervention will defer to vascular team regarding course (ordered) ?-Once DAPT is completed, lifelong aspirin 81 mg ?-ECHO pending, patient neurologically clear for discharge once this is complete with close outpatient follow-up  ?-Neurology will be available on an as needed basis going forward  ? ?Lesleigh Noe MD-PhD ?[facility] ?[phone]  ? ?Lesleigh Noe MD-PhD ?[facility] ?[phone]  ?Available 7 AM to 7 PM, outside these hours please contact Neurologist on call listed on AMION  ? ?>35 min spent in care of this patient today >50% at bedside reviewing workup and plan ?

## 2021-10-23 NOTE — Progress Notes (Signed)
OT Cancellation Note ? ?Patient Details ?Name: Betty Atkins ?MRN: [id number] ?DOB: April 19, 1933 ? ? ?Cancelled Treatment:    Reason Eval/Treat Not Completed: Medical issues which prohibited therapy;Other (comment). OT order received, chart reviewed. Pt noted with TNK administration '@1205'$  on 10/22/21. Per facility protocols she will require f/u imaging prior to initiation of therapy services. Will hold OT evaluation at this time and re-attempt at a later date/time as available and pt medically appropriate for OT services.  ? ?Shara Blazing, M.S., OTR/L ?[facility] ?Ascom: [phone] ?10/23/21, 12:33 PM ? ? ? ?

## 2021-10-23 NOTE — Progress Notes (Signed)
? ?NAME:  Betty Atkins, MRN:  [id number], DOB:  Sep 12, 1932, LOS: 1 ?ADMISSION DATE:  10/22/2021, CONSULTATION DATE: 10/22/2021 ?REFERRING MD: Dr. Charna Archer, CHIEF COMPLAINT: Left hand weakness/numbness   ? ?History of Present Illness:  ?This is an 88 yo female who presented to [facility] ER on 04/3 with left hand weakness/numbness and curling of the left fingers that lasted about 15 to 20 minutes than subsided spontaneously.  However, she had a recurrent episode of the curling of the left fingers that lasted 45 minutes prompting ER visit.  Onset of symptoms were at 09:30 am on 04/3. She also reported on 04/1 she had visual changes resulting in seeing dots that lasted 5 minutes which resolved spontaneously. ? ?ED Course ?Upon arrival to the ER per ER providers note the curling of the left fingers had resolved, however pt continued to have mild weakness of the left upper extremity.  Code stroke activated.  CT Head negative for acute intracranial abnormality, however revealed mild to moderate chronic small vessel ischemic changes within the cerebral white matter.  Neurology evaluated pt with recommendation to proceed with TNK administration.  ER lab results were unremarkable and ER vital signs were stable.  PCCM team contacted for ICU admission post TNK.  ? ?Pertinent  Medical History  ?Anemia  ?Degenerative Arthritis  ?Gout ?HLD ?HTN ?Pre-Diabetes ? ?Significant Hospital Events: ?Including procedures, antibiotic start and stop dates in addition to other pertinent events   ?04/3: Pt admitted to ICU left upper extremity numbness/tinging/finger curling concerning for lacunar stroke vs. focal seizures s/p TNK administration  ?04/3: CT Head revealed No evidence of acute intracranial abnormality. Mild-to-moderate chronic small vessel ischemic changes within the cerebral white matter. ?04/04: Neuro deficits resolved.  Stroke work up in progress.  Follow up 24 hr post TNK CTH pending around lunch.  Consult Vascular surgery for severe  right proximal internal carotid stenosis noted on MRA neck. ? ?Interim History / Subjective:  ?-S/p TNK yesterday at 12:00 ?-No significant events noted overnight ?-Afebrile, hemodynamically stable, no Vasopressors, on room air ?-Neuro deficits resolved ~ pt denies/does not exhibit any left sided weakness/numbness/tingling ?-MRI Head:  acute ischemic strokes in Right frontal lobe ?-MRA Head & Neck was negative for intracranial large vessel occlusion, did show severe stenosis (>80%) of the proximal right internal carotid artery ~ will need to consult Vascular Surgery ?-Hgb A1c 6.6 ?-Fasting lipid panel: Total cholesterol 183, HDL 51, LDL 90, Triglycerides 211 ?-24 hr post TNK follow up [place] scheduled for 12:30 ~ if negative then pt can move out of ICU ?-Echocardiogram, PT/OT still pending ? ?Objective   ?Blood pressure (!) 116/50, pulse 86, temperature 99 ?F (37.2 ?C), temperature source Oral, resp. rate 16, height '5\' 8"'$  (1.727 m), weight 105.3 kg, SpO2 97 %. ?   ?   ? ?Intake/Output Summary (Last 24 hours) at 10/23/2021 0900 ?Last data filed at 10/22/2021 1830 ?Gross per 24 hour  ?Intake 480 ml  ?Output 300 ml  ?Net 180 ml  ? ?Filed Weights  ? 10/22/21 1500 10/23/21 0406  ?Weight: 105.1 kg 105.3 kg  ? ? ?Examination: ?General: Elderly female resting in bed, NAD on RA ?HENT: Supple, no JVD  ?Lungs: Clear throughout, even, non labored  ?Cardiovascular: NSR, rrr, no m/r/g, 2+ radial/2+ distal pulses, no edema  ?Abdomen: +BS x4, non distended, non tender, obese ?Extremities: Normal bulk and tone, moves all extremities  ?Neuro: Alert and oriented, follows command, 5/5 BUE/BLE motor strength, no BLE/BUE drift  ?GU: Deferred  ? ?Resolved  Hospital Problem list   ? ?Assessment & Plan:  ? ?Left upper extremity weakness/numbness/finger curling concerning for possible lacunar stroke vs. focal seizure s/p TNK administration  ?- Continuous telemetry monitoring  ?- BP goal <180/105 for 24hrs post TNK ~ can begin to resume back home  Lotrel and HCTZ 24 to 48 hrs post stroke for GOAL BP <140/90 ?-MRI Head:  acute ischemic strokes in Right frontal lobe ?-MRA Head & Neck was negative for intracranial large vessel occlusion, did show severe stenosis (>80%) of the proximal right internal carotid artery ~ will consult Vascular Surgery ?- Echo pending  ?- Avoid antiplatelet and anticoagulation therapy 24hrs post TNK ?- Neurology following, appreciate input  ?- Neuro checks per NIH stroke scale ?- PT/OT consult ?-Hgb A1c 6.6 ?-Fasting lipid panel: Total cholesterol 183, HDL 51, LDL 90, Triglycerides 211 ~ continue Lipitor 40 mg daily ?-Repeat CTH @ 12:30 (24 hrs post TNK) ~ if negative, can transfer out of ICU today ? ?Mild Hypokalemia ?-Monitor I&O's / urinary output ?-Follow BMP ?-Ensure adequate renal perfusion ?-Avoid nephrotoxic agents as able ?-Replace electrolytes as indicated ?-Pharmacy following for assistance with electrolyte replacement ? ? ?Best Practice (right click and "Reselect all SmartList Selections" daily)  ? ?Diet/type: Regular ?DVT prophylaxis: SCD ?GI prophylaxis: N/A ?Lines: N/A ?Foley:  N/A ?Code Status:  full code ?Last date of multidisciplinary goals of care discussion [10/23/21] ? ?Labs   ?CBC: ?Recent Labs  ?Lab 10/22/21 ?1150 10/23/21 ?5366  ?WBC 7.1 8.4  ?NEUTROABS 3.1  --   ?HGB 14.2 13.3  ?HCT 43.2 40.1  ?MCV 89.4 87.7  ?PLT 280 260  ? ? ? ?Basic Metabolic Panel: ?Recent Labs  ?Lab 10/22/21 ?1150 10/23/21 ?4403  ?NA 139 138  ?K 3.6 3.4*  ?CL 105 105  ?CO2 27 24  ?GLUCOSE 106* 126*  ?BUN 15 13  ?CREATININE 1.06* 1.02*  ?CALCIUM 9.8 8.9  ?MG  --  1.7  ?PHOS  --  3.3  ? ? ?GFR: ?Estimated Creatinine Clearance: 50.3 mL/min (A) (by C-G formula based on SCr of 1.02 mg/dL (H)). ?Recent Labs  ?Lab 10/22/21 ?1150 10/23/21 ?4742  ?WBC 7.1 8.4  ? ? ? ?Liver Function Tests: ?Recent Labs  ?Lab 10/22/21 ?1150  ?AST 26  ?ALT 25  ?ALKPHOS 62  ?BILITOT 0.5  ?PROT 8.0  ?ALBUMIN 4.2  ? ? ?No results for input(s): LIPASE, AMYLASE in the last  168 hours. ?No results for input(s): AMMONIA in the last 168 hours. ? ?ABG ?No results found for: PHART, PCO2ART, PO2ART, HCO3, TCO2, ACIDBASEDEF, O2SAT  ? ?Coagulation Profile: ?Recent Labs  ?Lab 10/22/21 ?1150  ?INR 1.0  ? ? ? ?Cardiac Enzymes: ?No results for input(s): CKTOTAL, CKMB, CKMBINDEX, TROPONINI in the last 168 hours. ? ?HbA1C: ?Hgb A1c MFr Bld  ?Date/Time Value Ref Range Status  ?10/22/2021 11:49 AM 6.6 (H) 4.8 - 5.6 % Final  ?  Comment:  ?  (NOTE) ?        Prediabetes: 5.7 - 6.4 ?        Diabetes: >6.4 ?        Glycemic control for adults with diabetes: <7.0 ?  ? ? ?CBG: ?Recent Labs  ?Lab 10/22/21 ?1121 10/22/21 ?1454  ?GLUCAP 112* 138*  ? ? ? ?Review of Systems: Positives in BOLD  ?Pt denies all complaints ?Gen: Denies fever, chills, weight change, fatigue, night sweats ?HEENT: Denies blurred vision, double vision, hearing loss, tinnitus, sinus congestion, rhinorrhea, sore throat, neck stiffness, dysphagia ?PULM: Denies shortness of breath,  cough, sputum production, hemoptysis, wheezing ?CV: Denies chest pain, edema, orthopnea, paroxysmal nocturnal dyspnea, palpitations ?GI: Denies abdominal pain, nausea, vomiting, diarrhea, hematochezia, melena, constipation, change in bowel habits ?GU: Denies dysuria, hematuria, polyuria, oliguria, urethral discharge ?Endocrine: Denies hot or cold intolerance, polyuria, polyphagia or appetite change ?Derm: Denies rash, dry skin, scaling or peeling skin change ?Heme: Denies easy bruising, bleeding, bleeding gums ?Neuro: headache, left upper extremity numbness/weakness, left finger curling, slurred speech, loss of memory or consciousness ? ? ?Past Medical History:  ?She,  has a past medical history of Anemia, Arthritis, Dyspnea, Gout involving toe, Hyperlipidemia, Hypertension, and Pre-diabetes.  ? ?Surgical History:  ? ?Past Surgical History:  ?Procedure Laterality Date  ? ABDOMINAL HYSTERECTOMY    ? COLONOSCOPY WITH PROPOFOL N/A 05/02/2015  ? Procedure: COLONOSCOPY  WITH PROPOFOL;  Surgeon: Lollie Sails, MD;  Location: [facility] ENDOSCOPY;  Service: Endoscopy;  Laterality: N/A;  ? JOINT REPLACEMENT    ? right total hip  ?  ? ?Social History:  ? reports that she has

## 2021-10-24 ENCOUNTER — Inpatient Hospital Stay
Admit: 2021-10-24 | Discharge: 2021-10-24 | Disposition: A | Discharge status: Home / Self Care | Payer: Medicare Other | Attending: Critical Care Medicine | Admitting: Critical Care Medicine

## 2021-10-24 LAB — CBC
HCT: 41 % (ref 36.0–46.0)
Hemoglobin: 13.7 g/dL (ref 12.0–15.0)
MCH: 29.5 pg (ref 26.0–34.0)
MCHC: 33.4 g/dL (ref 30.0–36.0)
MCV: 88.2 fL (ref 80.0–100.0)
Platelets: 272 10*3/uL (ref 150–400)
RBC: 4.65 MIL/uL (ref 3.87–5.11)
RDW: 12.9 % (ref 11.5–15.5)
WBC: 7.5 10*3/uL (ref 4.0–10.5)
nRBC: 0 % (ref 0.0–0.2)

## 2021-10-24 LAB — BASIC METABOLIC PANEL
Anion gap: 9 (ref 5–15)
BUN: 13 mg/dL (ref 8–23)
CO2: 24 mmol/L (ref 22–32)
Calcium: 9.3 mg/dL (ref 8.9–10.3)
Chloride: 105 mmol/L (ref 98–111)
Creatinine, Ser: 1.07 mg/dL — ABNORMAL HIGH (ref 0.44–1.00)
GFR, Estimated: 51 mL/min — ABNORMAL LOW (ref >60)
Glucose, Bld: 149 mg/dL — ABNORMAL HIGH (ref 70–99)
Potassium: 3.6 mmol/L (ref 3.5–5.1)
Sodium: 138 mmol/L (ref 135–145)

## 2021-10-24 LAB — ECHOCARDIOGRAM COMPLETE
AR max vel: 2.11 cm2
AV Area VTI: 2.41 cm2
AV Area mean vel: 2.2 cm2
AV Mean grad: 7 mmHg
AV Peak grad: 11.1 mmHg
Ao pk vel: 1.67 m/s
Area-P 1/2: 7.66 cm2
Height: 68 in
S' Lateral: 2.5 cm
Weight: 3710.78 oz

## 2021-10-24 LAB — GLUCOSE, CAPILLARY
Glucose-Capillary: 118 mg/dL — ABNORMAL HIGH (ref 70–99)
Glucose-Capillary: 140 mg/dL — ABNORMAL HIGH (ref 70–99)
Glucose-Capillary: 144 mg/dL — ABNORMAL HIGH (ref 70–99)

## 2021-10-24 LAB — MAGNESIUM: Magnesium: 2.2 mg/dL (ref 1.7–2.4)

## 2021-10-24 MED ORDER — ATORVASTATIN CALCIUM 80 MG PO TABS: 80.0000 mg | ORAL_TABLET | Freq: Every day | ORAL | 0 refills | Status: AC

## 2021-10-24 MED ORDER — ATORVASTATIN CALCIUM 20 MG PO TABS
80.0000 mg | ORAL_TABLET | Freq: Every day | ORAL | Status: DC
  Administered 2021-10-24: 80 mg via ORAL
  Filled 2021-10-24: qty 4

## 2021-10-24 NOTE — Plan of Care (Signed)
?  Problem: Education: ?Goal: Knowledge of General Education information will improve ?Description: Including pain rating scale, medication(s)/side effects and non-pharmacologic comfort measures ?10/24/2021 1542 by , Camillo Flaming, RN ?Outcome: Adequate for Discharge ?10/24/2021 1118 by , Camillo Flaming, RN ?Outcome: Progressing ?  ?Problem: Health Behavior/Discharge Planning: ?Goal: Ability to manage health-related needs will improve ?10/24/2021 1542 by , Camillo Flaming, RN ?Outcome: Adequate for Discharge ?10/24/2021 1118 by , Camillo Flaming, RN ?Outcome: Progressing ?  ?Problem: Clinical Measurements: ?Goal: Ability to maintain clinical measurements within normal limits will improve ?10/24/2021 1542 by , Camillo Flaming, RN ?Outcome: Adequate for Discharge ?10/24/2021 1118 by , Camillo Flaming, RN ?Outcome: Progressing ?Goal: Will remain free from infection ?10/24/2021 1542 by , Camillo Flaming, RN ?Outcome: Adequate for Discharge ?10/24/2021 1118 by , Camillo Flaming, RN ?Outcome: Progressing ?Goal: Diagnostic test results will improve ?10/24/2021 1542 by , Camillo Flaming, RN ?Outcome: Adequate for Discharge ?10/24/2021 1118 by , Camillo Flaming, RN ?Outcome: Progressing ?Goal: Respiratory complications will improve ?10/24/2021 1542 by , Camillo Flaming, RN ?Outcome: Adequate for Discharge ?10/24/2021 1118 by , Camillo Flaming, RN ?Outcome: Progressing ?Goal: Cardiovascular complication will be avoided ?10/24/2021 1542 by , Camillo Flaming, RN ?Outcome: Adequate for Discharge ?10/24/2021 1118 by , Camillo Flaming, RN ?Outcome: Progressing ?  ?Problem: Activity: ?Goal: Risk for activity intolerance will decrease ?10/24/2021 1542 by , Camillo Flaming, RN ?Outcome: Adequate for Discharge ?10/24/2021 1118 by , Camillo Flaming, RN ?Outcome: Progressing ?  ?Problem: Nutrition: ?Goal: Adequate nutrition will be maintained ?10/24/2021 1542 by , Camillo Flaming, RN ?Outcome: Adequate for Discharge ?10/24/2021 1118 by , Camillo Flaming, RN ?Outcome: Progressing ?  ?Problem: Coping: ?Goal: Level of anxiety will decrease ?10/24/2021 1542 by , Camillo Flaming, RN ?Outcome: Adequate for Discharge ?10/24/2021 1118 by , Camillo Flaming, RN ?Outcome: Progressing ?  ?Problem: Elimination: ?Goal: Will not experience complications related to bowel motility ?10/24/2021 1542 by , Camillo Flaming, RN ?Outcome: Adequate for Discharge ?10/24/2021 1118 by , Camillo Flaming, RN ?Outcome: Progressing ?Goal: Will not experience complications related to urinary retention ?10/24/2021 1542 by , Camillo Flaming, RN ?Outcome: Adequate for Discharge ?10/24/2021 1118 by , Camillo Flaming, RN ?Outcome: Progressing ?  ?Problem: Pain Managment: ?Goal: General experience of comfort will improve ?10/24/2021 1542 by , Camillo Flaming, RN ?Outcome: Adequate for Discharge ?10/24/2021 1118 by , Camillo Flaming, RN ?Outcome: Progressing ?  ?Problem: Safety: ?Goal: Ability to remain free from injury will improve ?10/24/2021 1542 by , Camillo Flaming, RN ?Outcome: Adequate for Discharge ?10/24/2021 1118 by , Camillo Flaming, RN ?Outcome: Progressing ?  ?Problem: Skin Integrity: ?Goal: Risk for impaired skin integrity will decrease ?10/24/2021 1542 by , Camillo Flaming, RN ?Outcome: Adequate for Discharge ?10/24/2021 1118 by , Camillo Flaming, RN ?Outcome: Progressing ?  ?

## 2021-10-24 NOTE — Progress Notes (Signed)
*  PRELIMINARY RESULTS* ?Echocardiogram ?2D Echocardiogram has been performed. ? ?, Sonia Side ?10/24/2021, 7:55 AM ?

## 2021-10-24 NOTE — Plan of Care (Signed)

## 2021-10-24 NOTE — Progress Notes (Addendum)
? ?NAME:  Betty Atkins, MRN:  [id number], DOB:  June 01, 1933, LOS: 2 ?ADMISSION DATE:  10/22/2021, CONSULTATION DATE: 10/22/2021 ?REFERRING MD: Dr. Charna Archer, CHIEF COMPLAINT: Left hand weakness/numbness   ? ?History of Present Illness:  ?This is an 88 yo female who presented to [facility] ER on 04/3 with left hand weakness/numbness and curling of the left fingers that lasted about 15 to 20 minutes than subsided spontaneously.  However, she had a recurrent episode of the curling of the left fingers that lasted 45 minutes prompting ER visit.  Onset of symptoms were at 09:30 am on 04/3. She also reported on 04/1 she had visual changes resulting in seeing dots that lasted 5 minutes which resolved spontaneously. ? ?ED Course ?Upon arrival to the ER per ER providers note the curling of the left fingers had resolved, however pt continued to have mild weakness of the left upper extremity.  Code stroke activated.  CT Head negative for acute intracranial abnormality, however revealed mild to moderate chronic small vessel ischemic changes within the cerebral white matter.  Neurology evaluated pt with recommendation to proceed with TNK administration.  ER lab results were unremarkable and ER vital signs were stable.  PCCM team contacted for ICU admission post TNK.  ? ?Pertinent  Medical History  ?Anemia  ?Degenerative Arthritis  ?Gout ?HLD ?HTN ?Pre-Diabetes ? ?Significant Hospital Events: ?Including procedures, antibiotic start and stop dates in addition to other pertinent events   ?04/3: Pt admitted to ICU left upper extremity numbness/tinging/finger curling concerning for lacunar stroke vs. focal seizures s/p TNK administration  ?04/3: CT Head revealed No evidence of acute intracranial abnormality. Mild-to-moderate chronic small vessel ischemic changes within the cerebral white matter. ?04/04: Neuro deficits resolved.  Stroke work up in progress.  Follow up 24 hr post TNK CTH pending around lunch.  Consult Vascular surgery for severe  right proximal internal carotid stenosis noted on MRA neck. ?04/05: Echo performed this morning, Once resulted, will plan for discharge home with PT. ? ?Interim History / Subjective:  ?-Pt transferred to Med-Surg unit overnight ~ pt had bad experience as she felt she was "being dumped off" and staff not as attentive.  Pt became very anxious was worried that she could have another stroke ?-Afebrile, hemodynamically stable, on room air (did have some sinus tach while upset/anxious) ?-Able to discuss with pt and reassure her that she is on appropriate treatment measures, and that pending Echo results, plan is to discharge home later ~ after our discussion she is much relieved and in agreement for discharge ?-Seen by Vascular Surgery yesterday ~ recommends outpatient follow up in 1-2 weeks ?-Neuro deficits resolved ~ pt denies/does not exhibit any left sided weakness/numbness/tingling ? ? ?Objective   ?Blood pressure 133/72, pulse 99, temperature 98.4 ?F (36.9 ?C), resp. rate 18, height '5\' 8"'$  (1.727 m), weight 105.2 kg, SpO2 95 %. ?   ?   ? ?Intake/Output Summary (Last 24 hours) at 10/24/2021 0858 ?Last data filed at 10/23/2021 2300 ?Gross per 24 hour  ?Intake 410 ml  ?Output 1950 ml  ?Net -1540 ml  ? ? ?Filed Weights  ? 10/22/21 1500 10/23/21 0406 10/24/21 0500  ?Weight: 105.1 kg 105.3 kg 105.2 kg  ? ? ?Examination: ?General: Elderly female resting in bed, NAD on RA ?HENT: Supple, no JVD  ?Lungs: Clear throughout, even, non labored  ?Cardiovascular: NSR, rrr, no m/r/g, 2+ radial/2+ distal pulses, no edema  ?Abdomen: +BS x4, non distended, non tender, obese ?Extremities: Normal bulk and tone, moves all  extremities  ?Neuro: Alert and oriented, follows command, 5/5 BUE/BLE motor strength, no BLE/BUE drift  ?GU: external catheter in place ? ?Resolved Hospital Problem list   ? ?Assessment & Plan:  ? ?Left upper extremity weakness/numbness/finger curling in setting of Acute Ischemic CVA s/p TNK administration  ?-MRI Brain 10/22/21  with "2 subcentimeter acute infarcts within mid-to-posterior right frontal lobe, within the right ACA territory or ACA/MCA watershed territory. Additional subcentimeter acute infarct within the right parietal lobe, in the right MCA/PCA watershed territory. ?Background moderate chronic small vessel ischemic changes within ?the cerebral white matter and pons, slightly progressed from the ?prior brain MRI of 12/15/2006." ?- Continuous telemetry monitoring  ?-Begin to resume back home Lotrel and HCTZ 24 to 48 hrs post stroke for GOAL BP <140/90 ?-MRI Head:  acute ischemic strokes in Right frontal lobe ?-MRA Head & Neck was negative for intracranial large vessel occlusion, did show severe stenosis (>80%) of the proximal right internal carotid artery ~ Vascular Surgery consulted, recommends outpatient follow up in 1-2 weeks for likely Carotid Stenting ?- Echo performed, results pending  ?- Neurology following, appreciate input  ?- Neuro checks per NIH stroke scale ?- PT recommends home PT ?-Hgb A1c 6.6 ~ pt has refused insulin while in hospital ~ will defer to PCP for outpatient Diabetes management ?-Fasting lipid panel (on 40 mg Lipitor): Total cholesterol 183, HDL 51, LDL 90, Triglycerides 211 ~ Lipitor increased to 80 mg daily as per Neurology ?-Dual Antiplatelet therapy with Aspirin + Plavix for minimum of 90 days ? ?Mild Hypokalemia ~ resolved ?-Monitor I&O's / urinary output ?-Follow BMP ?-Ensure adequate renal perfusion ?-Avoid nephrotoxic agents as able ?-Replace electrolytes as indicated ?-Pharmacy following for assistance with electrolyte replacement ? ? ?Best Practice (right click and "Reselect all SmartList Selections" daily)  ? ?Diet/type: Regular ?DVT prophylaxis: SCD ?GI prophylaxis: N/A ?Lines: N/A ?Foley:  N/A ?Code Status:  full code ?Last date of multidisciplinary goals of care discussion [N/A] ? ?Labs   ?CBC: ?Recent Labs  ?Lab 10/22/21 ?1150 10/23/21 ?0454 10/24/21 ?0865  ?WBC 7.1 8.4 7.5  ?NEUTROABS  3.1  --   --   ?HGB 14.2 13.3 13.7  ?HCT 43.2 40.1 41.0  ?MCV 89.4 87.7 88.2  ?PLT 280 260 272  ? ? ? ?Basic Metabolic Panel: ?Recent Labs  ?Lab 10/22/21 ?1150 10/23/21 ?0454 10/24/21 ?7846  ?NA 139 138 138  ?K 3.6 3.4* 3.6  ?CL 105 105 105  ?CO2 '27 24 24  '$ ?GLUCOSE 106* 126* 149*  ?BUN '15 13 13  '$ ?CREATININE 1.06* 1.02* 1.07*  ?CALCIUM 9.8 8.9 9.3  ?MG  --  1.7 2.2  ?PHOS  --  3.3  --   ? ? ?GFR: ?Estimated Creatinine Clearance: 47.9 mL/min (A) (by C-G formula based on SCr of 1.07 mg/dL (H)). ?Recent Labs  ?Lab 10/22/21 ?1150 10/23/21 ?0454 10/24/21 ?9629  ?WBC 7.1 8.4 7.5  ? ? ? ?Liver Function Tests: ?Recent Labs  ?Lab 10/22/21 ?1150  ?AST 26  ?ALT 25  ?ALKPHOS 62  ?BILITOT 0.5  ?PROT 8.0  ?ALBUMIN 4.2  ? ? ?No results for input(s): LIPASE, AMYLASE in the last 168 hours. ?No results for input(s): AMMONIA in the last 168 hours. ? ?ABG ?No results found for: PHART, PCO2ART, PO2ART, HCO3, TCO2, ACIDBASEDEF, O2SAT  ? ?Coagulation Profile: ?Recent Labs  ?Lab 10/22/21 ?1150  ?INR 1.0  ? ? ? ?Cardiac Enzymes: ?No results for input(s): CKTOTAL, CKMB, CKMBINDEX, TROPONINI in the last 168 hours. ? ?HbA1C: ?Hgb A1c MFr Bld  ?  Date/Time Value Ref Range Status  ?10/22/2021 11:49 AM 6.6 (H) 4.8 - 5.6 % Final  ?  Comment:  ?  (NOTE) ?        Prediabetes: 5.7 - 6.4 ?        Diabetes: >6.4 ?        Glycemic control for adults with diabetes: <7.0 ?  ? ? ?CBG: ?Recent Labs  ?Lab 10/22/21 ?1454 10/23/21 ?1558 10/23/21 ?2003 10/24/21 ?0001 10/24/21 ?7195  ?GLUCAP 138* 132* 147* 118* 140*  ? ? ? ?Review of Systems: Positives in BOLD  ?Pt denies all complaints ?Gen: Denies fever, chills, weight change, fatigue, night sweats ?HEENT: Denies blurred vision, double vision, hearing loss, tinnitus, sinus congestion, rhinorrhea, sore throat, neck stiffness, dysphagia ?PULM: Denies shortness of breath, cough, sputum production, hemoptysis, wheezing ?CV: Denies chest pain, edema, orthopnea, paroxysmal nocturnal dyspnea, palpitations ?GI: Denies  abdominal pain, nausea, vomiting, diarrhea, hematochezia, melena, constipation, change in bowel habits ?GU: Denies dysuria, hematuria, polyuria, oliguria, urethral discharge ?Endocrine: Denies hot or

## 2021-10-24 NOTE — Consult Note (Signed)
PHARMACY CONSULT NOTE - FOLLOW UP ? ?Pharmacy Consult for Electrolyte Monitoring and Replacement  ? ?Recent Labs: ?Potassium (mmol/L)  ?Date Value  ?10/24/2021 3.6  ?06/28/2012 4.5  ? ?Magnesium (mg/dL)  ?Date Value  ?10/24/2021 2.2  ? ?Calcium (mg/dL)  ?Date Value  ?10/24/2021 9.3  ? ?Calcium, Total (mg/dL)  ?Date Value  ?06/28/2012 9.0  ? ?Albumin (g/dL)  ?Date Value  ?10/22/2021 4.2  ? ?Phosphorus (mg/dL)  ?Date Value  ?10/23/2021 3.3  ? ?Sodium (mmol/L)  ?Date Value  ?10/24/2021 138  ?06/28/2012 137  ? ? ? ?Assessment: ?88 y.o. female with past medical history of hypertension, hyperlipidemia, pre-diabetes, and anemia who presents to the ED complaining of weakness w/u c/f acute lacunar stroke. Symptoms of left arm numbness & weakness w/ LKW 0930, ultimately was administered TNK 4/03 12-noon. Admitted with acute ischemic stroke. Pharmacy consulted for electrolyte mgmt.  ? ?MRI of brain: two subcentimeter acute infarcts within the mid-to-posterior right frontal lobe. Additional subcentimeter acute infarct within the right parietal lobe. ? ?Estimate baseline Scr 1.1.  ? ?Goal of Therapy:  ?Lytes WNL ? ?Plan:  ?Electrolytes stable. Patient downgraded from ICU. Pharmacy will sign off on electrolyte management at this time and defer to MD ?Pharmacy will continue to monitor peripherally  ? ? ? ?Dorothe Pea, PharmD, BCPS ?Clinical Pharmacist   ?10/24/2021 ?7:51 AM ? ?

## 2021-10-24 NOTE — TOC Progression Note (Signed)
Transition of Care (TOC) - Progression Note  ? ? ?Patient Details  ?Name: Betty Atkins ?MRN: [id number] ?Date of Birth: March 10, 1933 ? ?Transition of Care (TOC) CM/SW Contact  ? A , LCSW ?Phone Number: ?10/24/2021, 2:41 PM ? ?Clinical Narrative:   [facility] will provide [facility] PT. Pt is aware and agreeable. ? ? ? ?Expected Discharge Plan: Home/Self Care ?Barriers to Discharge: Continued Medical Work up ? ?Expected Discharge Plan and Services ?Expected Discharge Plan: Home/Self Care ?  ?  ?  ?Living arrangements for the past 2 months: [place] ?                ?  ?  ?  ?  ?  ?  ?  ?  ?  ?  ? ? ?Social Determinants of Health (SDOH) Interventions ?  ? ?Readmission Risk Interventions ?   ? View : No data to display.  ?  ?  ?  ? ? ?

## 2021-10-24 NOTE — Evaluation (Signed)
Occupational Therapy Evaluation ?Patient Details ?Name: Betty Atkins ?MRN: [id number] ?DOB: Dec 01, 1932 ?Today's Date: 10/24/2021 ? ? ?History of Present Illness 88 yo female who presented to [facility] ER on 04/3 with left hand weakness/numbness and curling of the left fingers that lasted about 15 to 20 minutes than subsided spontaneously.  However, she had a recurrent episode of the curling of the left fingers that lasted 45 minutes prompting ER.  Found to have had CVA, treated with TNK.  ? ?Clinical Impression ?  ?Patient presenting with decreased Ind in self care, balance, functional mobility/transfers, endurance, and safety awareness. Patient reports being independent at baseline and living with daughter. She still drives and is engaged in IADL tasks at home. Her daughter is present during session.  Patient currently functioning at supervision overall for functional mobility and toileting need. Session started with use of RW and then pt ambulating without use of AD with supervision and no LOB. OT discussed recommendation to not place purewick back and encouragement for pt to ambulate to bathroom with staff or daughter in room.  Patient will benefit from acute OT to increase overall independence in the areas of ADLs, functional mobility, and safety awareness in order to safely discharge home with family.  ?   ? ?Recommendations for follow up therapy are one component of a multi-disciplinary discharge planning process, led by the attending physician.  Recommendations may be updated based on patient status, additional functional criteria and insurance authorization.  ? ?Follow Up Recommendations ? No OT follow up  ?  ?Assistance Recommended at Discharge Set up Supervision/Assistance  ?Patient can return home with the following A little help with bathing/dressing/bathroom;Help with stairs or ramp for entrance;Assist for transportation;Assistance with cooking/housework ? ?  ?   ?Equipment Recommendations ? None  recommended by OT  ?  ?   ?Precautions / Restrictions Precautions ?Precautions: Fall  ? ?  ? ?Mobility Bed Mobility ?Overal bed mobility: Independent ?  ?  ?  ?  ?  ?  ?General bed mobility comments: increased time to perform but no physical assistance needed ?  ? ?Transfers ?Overall transfer level: Needs assistance ?Equipment used: Rolling walker (2 wheels) ?Transfers: Sit to/from Stand, Bed to chair/wheelchair/BSC ?Sit to Stand: Supervision ?  ?  ?  ?  ?  ?  ?  ? ?  ?Balance Overall balance assessment: Needs assistance ?Sitting-balance support: No upper extremity supported ?Sitting balance-Leahy Scale: Normal ?  ?  ?Standing balance support: Bilateral upper extremity supported, During functional activity, No upper extremity supported ?Standing balance-Leahy Scale: Good ?  ?  ?  ?  ?  ?  ?  ?  ?  ?  ?  ?  ?   ? ?ADL either performed or assessed with clinical judgement  ? ?ADL Overall ADL's : Needs assistance/impaired ?  ?  ?Grooming: Wash/dry hands;Wash/dry face;Supervision/safety;Standing ?  ?  ?  ?  ?  ?  ?  ?  ?  ?Toilet Transfer: Supervision/safety;Regular Toilet;Rolling walker (2 wheels) ?  ?Toileting- Clothing Manipulation and Hygiene: Supervision/safety;Sit to/from stand ?  ?  ?  ?Functional mobility during ADLs: Supervision/safety ?   ? ? ? ?Vision Patient Visual Report: No change from baseline ?   ?   ?   ?   ? ?Pertinent Vitals/Pain Pain Assessment ?Pain Assessment: No/denies pain  ? ? ? ?Hand Dominance Right ?  ?Extremity/Trunk Assessment Upper Extremity Assessment ?Upper Extremity Assessment: Overall WFL for tasks assessed ?  ?Lower Extremity Assessment ?Lower Extremity  Assessment: Overall WFL for tasks assessed ?  ?  ?  ?Communication Communication ?Communication: No difficulties ?  ?Cognition Arousal/Alertness: Awake/alert ?Behavior During Therapy: [facility] for tasks assessed/performed ?Overall Cognitive Status: Within Functional Limits for tasks assessed ?  ?  ?  ?  ?  ?  ?  ?  ?  ?  ?  ?  ?  ?  ?  ?  ?   ?  ?  ?   ?   ?   ? ? ?Home Living Family/patient expects to be discharged to:: Private residence ?Living Arrangements: Children ?Available Help at Discharge: Available PRN/intermittently ?  ?Home Access: Stairs to enter ?Entrance Stairs-Number of Steps: 2 ?Entrance Stairs-Rails: None ?Home Layout: Laundry or work area in basement;Two level ?  ?  ?Bathroom Shower/Tub: Walk-in shower ?  ?  ?  ?  ?Home Equipment: Conservation officer, nature (2 wheels);Cane - single point;Grab bars - tub/shower ?  ?  ?  ? ?  ?Prior Functioning/Environment Prior Level of Function : Independent/Modified Independent ?  ?  ?  ?  ?  ?  ?Mobility Comments: Pt does not use AD, drives/out in community a few times/week ?ADLs Comments: Pt is able to perform all ADLs and cooks independently. Family assists as needed. ?  ? ?  ?  ?   ?   ?OT Treatment/Interventions: Self-care/ADL training;Therapeutic exercise;Energy conservation;DME and/or AE instruction;Therapeutic activities;Cognitive remediation/compensation;Balance training;Patient/family education;Modalities;Manual therapy  ?  ?OT Goals(Current goals can be found in the care plan section) Acute Rehab OT Goals ?Patient Stated Goal: to go home ?OT Goal Formulation: With patient/family ?Time For Goal Achievement: 10/24/21 ?Potential to Achieve Goals: Good  ?OT Frequency:   ?  ? ?   ?AM-PAC OT "6 Clicks" Daily Activity     ?Outcome Measure Help from another person eating meals?: None ?Help from another person taking care of personal grooming?: None ?Help from another person toileting, which includes using toliet, bedpan, or urinal?: None ?Help from another person bathing (including washing, rinsing, drying)?: A Little ?Help from another person to put on and taking off regular upper body clothing?: None ?Help from another person to put on and taking off regular lower body clothing?: None ?6 Click Score: 23 ?  ?End of Session Equipment Utilized During Treatment: Rolling walker (2 wheels) ?Nurse Communication:  Mobility status ? ?Activity Tolerance: Patient tolerated treatment well ?Patient left: in bed;with call bell/phone within reach;with family/visitor present ? ?   ?              ?Time: 3646-8032 ?OT Time Calculation (min): 39 min ?Charges:  OT General Charges ?$OT Visit: 1 Visit ?OT Evaluation ?$OT Eval Low Complexity: 1 Low ?OT Treatments ?$Self Care/Home Management : 8-22 mins ?$Therapeutic Activity: 8-22 mins ? ?Darleen Crocker, MS, OTR/L , CBIS ?ascom [phone]  ?10/24/21, 1:42 PM  ?

## 2021-11-13 ENCOUNTER — Encounter (INDEPENDENT_AMBULATORY_CARE_PROVIDER_SITE_OTHER): Payer: Self-pay | Admitting: Vascular Surgery

## 2021-11-13 ENCOUNTER — Ambulatory Visit (INDEPENDENT_AMBULATORY_CARE_PROVIDER_SITE_OTHER): Payer: Medicare Other | Admitting: Vascular Surgery

## 2021-11-13 VITALS — BP 189/82 | HR 99 | Ht 68.0 in | Wt 223.0 lb

## 2021-11-13 DIAGNOSIS — I63239 Cerebral infarction due to unspecified occlusion or stenosis of unspecified carotid arteries: Secondary | ICD-10-CM | POA: Diagnosis not present

## 2021-11-13 DIAGNOSIS — E782 Mixed hyperlipidemia: Secondary | ICD-10-CM | POA: Diagnosis not present

## 2021-11-13 DIAGNOSIS — I10 Essential (primary) hypertension: Secondary | ICD-10-CM | POA: Diagnosis not present

## 2021-11-13 DIAGNOSIS — E119 Type 2 diabetes mellitus without complications: Secondary | ICD-10-CM

## 2021-11-13 NOTE — Progress Notes (Signed)
? ? ?Patient ID: Betty Atkins, female   DOB: 11-Jun-1933, 88 y.o.   MRN: [id number] ? ?Chief Complaint  ?Patient presents with  ? Establish Care  ?  Referred by Dr Ola Spurr  ? ? ?HPI ?CHERALYN Atkins is a 88 y.o. female.  I am asked to see the patient by Dr. Ola Spurr for evaluation of symptomatic carotid stenosis.  About 2 to 3 weeks ago, the patient was hospitalized with a stroke and found to have a critical right carotid stenosis on a MR angiogram which I have independently reviewed.  She had a litany of studies and other work-up.  She is very functional and active woman.  She is very mentally sharp.  Her symptoms have largely resolved and she is doing well.  She is now on aspirin, Plavix, and Lipitor and tolerating this well.  She is here to discuss her treatment options for her symptomatic carotid stenosis ? ? ?Past Medical History:  ?Diagnosis Date  ? Anemia   ? Arthritis   ? degenerative  ? Dyspnea   ? Gout involving toe   ? Hyperlipidemia   ? Hypertension   ? Pre-diabetes   ? ? ?Past Surgical History:  ?Procedure Laterality Date  ? ABDOMINAL HYSTERECTOMY    ? COLONOSCOPY WITH PROPOFOL N/A 05/02/2015  ? Procedure: COLONOSCOPY WITH PROPOFOL;  Surgeon: Lollie Sails, MD;  Location: [facility] ENDOSCOPY;  Service: Endoscopy;  Laterality: N/A;  ? JOINT REPLACEMENT    ? right total hip  ? ? ? ?Family History  ?Problem Relation Age of Onset  ? Breast cancer Sister 66  ? Diabetes Sister   ?No bleeding or clotting disorders ?No aneurysms ? ? ?Social History  ? ?Tobacco Use  ? Smoking status: Never  ? Smokeless tobacco: Never  ?Vaping Use  ? Vaping Use: Never used  ?Substance Use Topics  ? Alcohol use: No  ? Drug use: No  ? ? ? ?Allergies  ?Allergen Reactions  ? Iodinated Contrast Media Hives and Itching  ?  I gave the patient 155m of isovue 300. As i was getting her up after the scan she was itching the Left side of her face. She had developed two hives on the left side of her face. Dr. SClovis Rileycame and  evaluated her. We gave her '50mg'$  of Benadryl and her symptoms improved. SPM ?I gave the patient 1072mof isovue 300. As i was getting her up after the scan she was itching the Left side of her face. She had developed two hives on the left side of her face. Dr. StClovis Rileyame and evaluated her. We gave her '50mg'$  of Benadryl and her symptoms improved. SPM ?I gave the patient 1006mf isovue 300. As i was getting her up after the scan she was itching the Left side of her face. She had developed two hives on the left side of her face. Dr. StrClovis Rileyme and evaluated her. We gave her '50mg'$  of Benadryl and her symptoms improved. SPM ? ?I gave the patient 100m52m isovue 300. As i was getting her up after the scan she was itching the Left side of her face. She had developed two hives on the left side of her face. Dr. StroClovis Rileye and evaluated her. We gave her '50mg'$  of Benadryl and her symptoms improved. SPM ?I gave the patient 100ml64misovue 300. As i was getting her up after the scan she was itching the Left side of her face. She had developed two hives  on the left side of her face. Dr. Clovis Riley came and evaluated her. We gave her '50mg'$  of Benadryl and her symptoms improved. SPM ?I gave the patient 124m of isovue 300. As i was getting her up after the scan she was itching the Left side of her face. She had developed two hives on the left side of her face. Dr. SClovis Rileycame and evaluated her. We gave her '50mg'$  of Benadryl and her symptoms improved. SPM ?  ? Metrizamide Hives and Itching  ?  I gave the patient 105mof isovue 300. As i was getting her up after the scan she was itching the Left side of her face. She had developed two hives on the left side of her face. Dr. StClovis Rileyame and evaluated her. We gave her '50mg'$  of Benadryl and her symptoms improved. SPM  ? ? ?Current Outpatient Medications  ?Medication Sig Dispense Refill  ? allopurinol (ZYLOPRIM) 100 MG tablet Take 100 mg by mouth daily.    ? amLODipine-benazepril (LOTREL) 5-20  MG capsule Take 1 capsule by mouth daily.    ? aspirin EC 81 MG EC tablet Take 1 tablet (81 mg total) by mouth daily. Swallow whole. 30 tablet 11  ? atorvastatin (LIPITOR) 80 MG tablet Take 1 tablet (80 mg total) by mouth daily. 30 tablet 0  ? cetirizine (ZYRTEC) 10 MG tablet Take by mouth as needed.    ? Ciclopirox 1 % shampoo APP ON THE SKIN UTD    ? clobetasol ointment (TEMOVATE) 0.05 % Apply to affected area every night for 4 weeks, then every other day for 4 weeks and then twice a week for 4 weeks or until resolution. 30 g 5  ? clopidogrel (PLAVIX) 75 MG tablet Take 1 tablet (75 mg total) by mouth daily. 30 tablet 2  ? docusate sodium (COLACE) 100 MG capsule Take 1 capsule (100 mg total) by mouth 2 (two) times daily as needed for mild constipation. 10 capsule 0  ? hydrochlorothiazide (HYDRODIURIL) 25 MG tablet Take 25 mg by mouth daily.    ? Multiple Vitamin (MULTIVITAMIN) tablet Take 1 tablet by mouth daily.    ? timolol (TIMOPTIC) 0.5 % ophthalmic solution Place 1 drop into both eyes 2 (two) times daily.    ? Vitamin D3 (VITAMIN D) 25 MCG tablet Take 1,000 Units by mouth daily.    ? Calcium Carbonate-Vit D-Min (CALTRATE PLUS PO) Take 1 tablet by mouth daily.  (Patient not taking: Reported on 11/13/2021)    ? ?No current facility-administered medications for this visit.  ? ? ? ? ?REVIEW OF SYSTEMS (Negative unless checked) ? ?Constitutional: '[]'$ Weight loss  '[]'$ Fever  '[]'$ Chills ?Cardiac: '[]'$ Chest pain   '[]'$ Chest pressure   '[]'$ Palpitations   '[]'$ Shortness of breath when laying flat   '[]'$ Shortness of breath at rest   '[]'$ Shortness of breath with exertion. ?Vascular:  '[]'$ Pain in legs with walking   '[]'$ Pain in legs at rest   '[]'$ Pain in legs when laying flat   '[]'$ Claudication   '[]'$ Pain in feet when walking  '[]'$ Pain in feet at rest  '[]'$ Pain in feet when laying flat   '[]'$ History of DVT   '[]'$ Phlebitis   '[]'$ Swelling in legs   '[]'$ Varicose veins   '[]'$ Non-healing ulcers ?Pulmonary:   '[]'$ Uses home oxygen   '[]'$ Productive cough   '[]'$ Hemoptysis    '[]'$ Wheeze  '[]'$ COPD   '[]'$ Asthma ?Neurologic:  '[]'$ Dizziness  '[]'$ Blackouts   '[]'$ Seizures   '[x]'$ History of stroke   '[]'$ History of TIA  '[]'$ Aphasia   '[]'$ Temporary blindness   '[]'$   Dysphagia   '[x]'$ Weakness or numbness in arms   '[]'$ Weakness or numbness in legs ?Musculoskeletal:  '[]'$ Arthritis   '[]'$ Joint swelling   '[]'$ Joint pain   '[]'$ Low back pain ?Hematologic:  '[]'$ Easy bruising  '[]'$ Easy bleeding   '[]'$ Hypercoagulable state   '[]'$ Anemic  '[]'$ Hepatitis ?Gastrointestinal:  '[]'$ Blood in stool   '[]'$ Vomiting blood  '[]'$ Gastroesophageal reflux/heartburn   '[]'$ Abdominal pain ?Genitourinary:  '[]'$ Chronic kidney disease   '[]'$ Difficult urination  '[]'$ Frequent urination  '[]'$ Burning with urination   '[]'$ Hematuria ?Skin:  '[]'$ Rashes   '[]'$ Ulcers   '[]'$ Wounds ?Psychological:  '[]'$ History of anxiety   '[]'$  History of major depression. ? ? ? ?Physical Exam ?BP (!) 189/82 (BP Location: Left Arm)   Pulse 99   Ht '5\' 8"'$  (1.727 m)   Wt 223 lb (101.2 kg)   SpO2 (!) 18%   BMI 33.91 kg/m?  ?Gen:  WD/WN, NAD. Appears younger than stated age. ?Head: [place]/AT, No temporalis wasting.  ?Ear/Nose/Throat: Hearing grossly intact, nares w/o erythema or drainage, oropharynx w/o Erythema/Exudate ?Eyes: Conjunctiva clear, sclera non-icteric  ?Neck: trachea midline.  No JVD.  ?Pulmonary:  Good air movement, respirations not labored, no use of accessory muscles  ?Cardiac: RRR, no JVD ?Vascular:  ?Vessel Right Left  ?Radial Palpable Palpable  ?    ?    ?    ?    ?    ?    ?    ?    ? ?Gastrointestinal:. No masses, surgical incisions, or scars. ?Musculoskeletal: M/S 5/5 throughout.  Extremities without ischemic changes.  No deformity or atrophy. 1+ BLE edema. ?Neurologic: Sensation grossly intact in extremities.  Symmetrical.  Speech is fluent. Motor exam as listed above. ?Psychiatric: Judgment intact, Mood & affect appropriate for pt's clinical situation. ?Dermatologic: No rashes or ulcers noted.  No cellulitis or open wounds. ? ? ? ?Radiology ?CT HEAD WO CONTRAST (5MM) ? ?Result Date: 10/23/2021 ?CLINICAL DATA:   Stroke follow-up EXAM: CT HEAD WITHOUT CONTRAST TECHNIQUE: Contiguous axial images were obtained from the base of the skull through the vertex without intravenous contrast. RADIATION DOSE REDUCTION: This exam was perfo

## 2021-11-13 NOTE — Assessment & Plan Note (Signed)
lipid control important in reducing the progression of atherosclerotic disease. Continue statin therapy  

## 2021-11-13 NOTE — H&P (View-Only) (Signed)
? ? ?Patient ID: Betty Atkins, female   DOB: Mar 15, 1933, 88 y.o.   MRN: [id number] ? ?Chief Complaint  ?Patient presents with  ? Establish Care  ?  Referred by Dr Ola Spurr  ? ? ?HPI ?Betty Atkins is a 88 y.o. female.  I am asked to see the patient by Dr. Ola Spurr for evaluation of symptomatic carotid stenosis.  About 2 to 3 weeks ago, the patient was hospitalized with a stroke and found to have a critical right carotid stenosis on a MR angiogram which I have independently reviewed.  She had a litany of studies and other work-up.  She is very functional and active woman.  She is very mentally sharp.  Her symptoms have largely resolved and she is doing well.  She is now on aspirin, Plavix, and Lipitor and tolerating this well.  She is here to discuss her treatment options for her symptomatic carotid stenosis ? ? ?Past Medical History:  ?Diagnosis Date  ? Anemia   ? Arthritis   ? degenerative  ? Dyspnea   ? Gout involving toe   ? Hyperlipidemia   ? Hypertension   ? Pre-diabetes   ? ? ?Past Surgical History:  ?Procedure Laterality Date  ? ABDOMINAL HYSTERECTOMY    ? COLONOSCOPY WITH PROPOFOL N/A 05/02/2015  ? Procedure: COLONOSCOPY WITH PROPOFOL;  Surgeon: Lollie Sails, MD;  Location: [facility] ENDOSCOPY;  Service: Endoscopy;  Laterality: N/A;  ? JOINT REPLACEMENT    ? right total hip  ? ? ? ?Family History  ?Problem Relation Age of Onset  ? Breast cancer Sister 5  ? Diabetes Sister   ?No bleeding or clotting disorders ?No aneurysms ? ? ?Social History  ? ?Tobacco Use  ? Smoking status: Never  ? Smokeless tobacco: Never  ?Vaping Use  ? Vaping Use: Never used  ?Substance Use Topics  ? Alcohol use: No  ? Drug use: No  ? ? ? ?Allergies  ?Allergen Reactions  ? Iodinated Contrast Media Hives and Itching  ?  I gave the patient 184m of isovue 300. As i was getting her up after the scan she was itching the Left side of her face. She had developed two hives on the left side of her face. Dr. SClovis Rileycame and  evaluated her. We gave her '50mg'$  of Benadryl and her symptoms improved. SPM ?I gave the patient 1069mof isovue 300. As i was getting her up after the scan she was itching the Left side of her face. She had developed two hives on the left side of her face. Dr. StClovis Rileyame and evaluated her. We gave her '50mg'$  of Benadryl and her symptoms improved. SPM ?I gave the patient 10055mf isovue 300. As i was getting her up after the scan she was itching the Left side of her face. She had developed two hives on the left side of her face. Dr. StrClovis Rileyme and evaluated her. We gave her '50mg'$  of Benadryl and her symptoms improved. SPM ? ?I gave the patient 100m66m isovue 300. As i was getting her up after the scan she was itching the Left side of her face. She had developed two hives on the left side of her face. Dr. StroClovis Rileye and evaluated her. We gave her '50mg'$  of Benadryl and her symptoms improved. SPM ?I gave the patient 100ml75misovue 300. As i was getting her up after the scan she was itching the Left side of her face. She had developed two hives  on the left side of her face. Dr. Clovis Riley came and evaluated her. We gave her '50mg'$  of Benadryl and her symptoms improved. SPM ?I gave the patient 153m of isovue 300. As i was getting her up after the scan she was itching the Left side of her face. She had developed two hives on the left side of her face. Dr. SClovis Rileycame and evaluated her. We gave her '50mg'$  of Benadryl and her symptoms improved. SPM ?  ? Metrizamide Hives and Itching  ?  I gave the patient 1049mof isovue 300. As i was getting her up after the scan she was itching the Left side of her face. She had developed two hives on the left side of her face. Dr. StClovis Rileyame and evaluated her. We gave her '50mg'$  of Benadryl and her symptoms improved. SPM  ? ? ?Current Outpatient Medications  ?Medication Sig Dispense Refill  ? allopurinol (ZYLOPRIM) 100 MG tablet Take 100 mg by mouth daily.    ? amLODipine-benazepril (LOTREL) 5-20  MG capsule Take 1 capsule by mouth daily.    ? aspirin EC 81 MG EC tablet Take 1 tablet (81 mg total) by mouth daily. Swallow whole. 30 tablet 11  ? atorvastatin (LIPITOR) 80 MG tablet Take 1 tablet (80 mg total) by mouth daily. 30 tablet 0  ? cetirizine (ZYRTEC) 10 MG tablet Take by mouth as needed.    ? Ciclopirox 1 % shampoo APP ON THE SKIN UTD    ? clobetasol ointment (TEMOVATE) 0.05 % Apply to affected area every night for 4 weeks, then every other day for 4 weeks and then twice a week for 4 weeks or until resolution. 30 g 5  ? clopidogrel (PLAVIX) 75 MG tablet Take 1 tablet (75 mg total) by mouth daily. 30 tablet 2  ? docusate sodium (COLACE) 100 MG capsule Take 1 capsule (100 mg total) by mouth 2 (two) times daily as needed for mild constipation. 10 capsule 0  ? hydrochlorothiazide (HYDRODIURIL) 25 MG tablet Take 25 mg by mouth daily.    ? Multiple Vitamin (MULTIVITAMIN) tablet Take 1 tablet by mouth daily.    ? timolol (TIMOPTIC) 0.5 % ophthalmic solution Place 1 drop into both eyes 2 (two) times daily.    ? Vitamin D3 (VITAMIN D) 25 MCG tablet Take 1,000 Units by mouth daily.    ? Calcium Carbonate-Vit D-Min (CALTRATE PLUS PO) Take 1 tablet by mouth daily.  (Patient not taking: Reported on 11/13/2021)    ? ?No current facility-administered medications for this visit.  ? ? ? ? ?REVIEW OF SYSTEMS (Negative unless checked) ? ?Constitutional: '[]'$ Weight loss  '[]'$ Fever  '[]'$ Chills ?Cardiac: '[]'$ Chest pain   '[]'$ Chest pressure   '[]'$ Palpitations   '[]'$ Shortness of breath when laying flat   '[]'$ Shortness of breath at rest   '[]'$ Shortness of breath with exertion. ?Vascular:  '[]'$ Pain in legs with walking   '[]'$ Pain in legs at rest   '[]'$ Pain in legs when laying flat   '[]'$ Claudication   '[]'$ Pain in feet when walking  '[]'$ Pain in feet at rest  '[]'$ Pain in feet when laying flat   '[]'$ History of DVT   '[]'$ Phlebitis   '[]'$ Swelling in legs   '[]'$ Varicose veins   '[]'$ Non-healing ulcers ?Pulmonary:   '[]'$ Uses home oxygen   '[]'$ Productive cough   '[]'$ Hemoptysis    '[]'$ Wheeze  '[]'$ COPD   '[]'$ Asthma ?Neurologic:  '[]'$ Dizziness  '[]'$ Blackouts   '[]'$ Seizures   '[x]'$ History of stroke   '[]'$ History of TIA  '[]'$ Aphasia   '[]'$ Temporary blindness   '[]'$   Dysphagia   '[x]'$ Weakness or numbness in arms   '[]'$ Weakness or numbness in legs ?Musculoskeletal:  '[]'$ Arthritis   '[]'$ Joint swelling   '[]'$ Joint pain   '[]'$ Low back pain ?Hematologic:  '[]'$ Easy bruising  '[]'$ Easy bleeding   '[]'$ Hypercoagulable state   '[]'$ Anemic  '[]'$ Hepatitis ?Gastrointestinal:  '[]'$ Blood in stool   '[]'$ Vomiting blood  '[]'$ Gastroesophageal reflux/heartburn   '[]'$ Abdominal pain ?Genitourinary:  '[]'$ Chronic kidney disease   '[]'$ Difficult urination  '[]'$ Frequent urination  '[]'$ Burning with urination   '[]'$ Hematuria ?Skin:  '[]'$ Rashes   '[]'$ Ulcers   '[]'$ Wounds ?Psychological:  '[]'$ History of anxiety   '[]'$  History of major depression. ? ? ? ?Physical Exam ?BP (!) 189/82 (BP Location: Left Arm)   Pulse 99   Ht '5\' 8"'$  (1.727 m)   Wt 223 lb (101.2 kg)   SpO2 (!) 18%   BMI 33.91 kg/m?  ?Gen:  WD/WN, NAD. Appears younger than stated age. ?Head: [place]/AT, No temporalis wasting.  ?Ear/Nose/Throat: Hearing grossly intact, nares w/o erythema or drainage, oropharynx w/o Erythema/Exudate ?Eyes: Conjunctiva clear, sclera non-icteric  ?Neck: trachea midline.  No JVD.  ?Pulmonary:  Good air movement, respirations not labored, no use of accessory muscles  ?Cardiac: RRR, no JVD ?Vascular:  ?Vessel Right Left  ?Radial Palpable Palpable  ?    ?    ?    ?    ?    ?    ?    ?    ? ?Gastrointestinal:. No masses, surgical incisions, or scars. ?Musculoskeletal: M/S 5/5 throughout.  Extremities without ischemic changes.  No deformity or atrophy. 1+ BLE edema. ?Neurologic: Sensation grossly intact in extremities.  Symmetrical.  Speech is fluent. Motor exam as listed above. ?Psychiatric: Judgment intact, Mood & affect appropriate for pt's clinical situation. ?Dermatologic: No rashes or ulcers noted.  No cellulitis or open wounds. ? ? ? ?Radiology ?CT HEAD WO CONTRAST (5MM) ? ?Result Date: 10/23/2021 ?CLINICAL DATA:   Stroke follow-up EXAM: CT HEAD WITHOUT CONTRAST TECHNIQUE: Contiguous axial images were obtained from the base of the skull through the vertex without intravenous contrast. RADIATION DOSE REDUCTION: This exam was perfo

## 2021-11-13 NOTE — Assessment & Plan Note (Signed)
blood glucose control important in reducing the progression of atherosclerotic disease. Also, involved in wound healing. On appropriate medications.  

## 2021-11-13 NOTE — Assessment & Plan Note (Signed)
blood pressure control important in reducing the progression of atherosclerotic disease. On appropriate oral medications.  

## 2021-11-13 NOTE — Assessment & Plan Note (Signed)
The patient has an MR angiogram which I have independently reviewed which shows a critical appearing lesion in the right internal carotid artery centimeters so beyond the origin.  This appears to be 85% or greater by MR angiogram.  This is associated with a recent stroke about 2 to 3 weeks ago.  At this point, the patient should have surgery or stenting for stroke risk reduction.  I discussed the risks and benefits of both procedures.  I discussed the differences between the 2.  Her anatomy was reasonably good for stenting as long as extensive calcification that would not be shown on the MR angiogram is not present.  Given her age and being able to avoid general anesthesia with a stent, I think this would be the best option.  Carotid angiogram with stent placement is planned for the near future at her convenience.  If we do find anatomy that is not amenable to stenting at the time of angiogram, carotid endarterectomy can be planned at a later date. ?

## 2021-11-14 ENCOUNTER — Telehealth (INDEPENDENT_AMBULATORY_CARE_PROVIDER_SITE_OTHER): Payer: Self-pay

## 2021-11-14 NOTE — Telephone Encounter (Signed)
Spoke with the patient and she is scheduled with Dr. Lucky Cowboy for a right carotid stent placement on 11/19/21 with a 8:15 am arrival time to the MM. Pre-procedure instructions were discussed and will be mailed. ?

## 2021-11-19 ENCOUNTER — Encounter: Payer: Self-pay | Admitting: Vascular Surgery

## 2021-11-19 ENCOUNTER — Other Ambulatory Visit: Payer: Self-pay

## 2021-11-19 ENCOUNTER — Inpatient Hospital Stay
Admission: RE | Admit: 2021-11-19 | Discharge: 2021-11-20 | DRG: 036 | Disposition: A | Discharge status: Home / Self Care | Payer: Medicare Other | Source: Ambulatory Visit | Attending: Vascular Surgery | Admitting: Vascular Surgery

## 2021-11-19 ENCOUNTER — Encounter
Admission: RE | Disposition: A | Discharge status: Home / Self Care | Payer: Self-pay | Source: Ambulatory Visit | Attending: Vascular Surgery

## 2021-11-19 DIAGNOSIS — Z006 Encounter for examination for normal comparison and control in clinical research program: Secondary | ICD-10-CM

## 2021-11-19 DIAGNOSIS — Z7982 Long term (current) use of aspirin: Secondary | ICD-10-CM

## 2021-11-19 DIAGNOSIS — I63231 Cerebral infarction due to unspecified occlusion or stenosis of right carotid arteries: Secondary | ICD-10-CM

## 2021-11-19 DIAGNOSIS — E119 Type 2 diabetes mellitus without complications: Secondary | ICD-10-CM | POA: Diagnosis present

## 2021-11-19 DIAGNOSIS — I10 Essential (primary) hypertension: Secondary | ICD-10-CM | POA: Diagnosis present

## 2021-11-19 DIAGNOSIS — Z833 Family history of diabetes mellitus: Secondary | ICD-10-CM | POA: Diagnosis not present

## 2021-11-19 DIAGNOSIS — Z8673 Personal history of transient ischemic attack (TIA), and cerebral infarction without residual deficits: Secondary | ICD-10-CM

## 2021-11-19 DIAGNOSIS — M109 Gout, unspecified: Secondary | ICD-10-CM | POA: Diagnosis present

## 2021-11-19 DIAGNOSIS — I63239 Cerebral infarction due to unspecified occlusion or stenosis of unspecified carotid arteries: Principal | ICD-10-CM | POA: Diagnosis present

## 2021-11-19 DIAGNOSIS — I6521 Occlusion and stenosis of right carotid artery: Principal | ICD-10-CM | POA: Diagnosis present

## 2021-11-19 DIAGNOSIS — Z79899 Other long term (current) drug therapy: Secondary | ICD-10-CM | POA: Diagnosis not present

## 2021-11-19 DIAGNOSIS — E782 Mixed hyperlipidemia: Secondary | ICD-10-CM | POA: Diagnosis present

## 2021-11-19 HISTORY — PX: CAROTID PTA/STENT INTERVENTION: CATH118231

## 2021-11-19 LAB — POCT ACTIVATED CLOTTING TIME: Activated Clotting Time: 281 seconds

## 2021-11-19 LAB — CREATININE, SERUM
Creatinine, Ser: 1.24 mg/dL — ABNORMAL HIGH (ref 0.44–1.00)
GFR, Estimated: 42 mL/min — ABNORMAL LOW (ref >60)

## 2021-11-19 LAB — GLUCOSE, CAPILLARY: Glucose-Capillary: 148 mg/dL — ABNORMAL HIGH (ref 70–99)

## 2021-11-19 LAB — BUN: BUN: 15 mg/dL (ref 8–23)

## 2021-11-19 SURGERY — CAROTID PTA/STENT INTERVENTION
Anesthesia: Moderate Sedation | Laterality: Right

## 2021-11-19 MED ORDER — ASPIRIN EC 81 MG PO TBEC
81.0000 mg | DELAYED_RELEASE_TABLET | Freq: Every day | ORAL | Status: DC
  Administered 2021-11-20: 81 mg via ORAL
  Filled 2021-11-19: qty 1

## 2021-11-19 MED ORDER — ATORVASTATIN CALCIUM 20 MG PO TABS
80.0000 mg | ORAL_TABLET | Freq: Every day | ORAL | Status: DC
  Administered 2021-11-20: 80 mg via ORAL
  Filled 2021-11-19: qty 4

## 2021-11-19 MED ORDER — HEPARIN SODIUM (PORCINE) 1000 UNIT/ML IJ SOLN
INTRAMUSCULAR | Status: AC
  Filled 2021-11-19: qty 10

## 2021-11-19 MED ORDER — FENTANYL CITRATE (PF) 100 MCG/2ML IJ SOLN
INTRAMUSCULAR | Status: DC | PRN
Start: 2021-11-19 — End: 2021-11-19
  Administered 2021-11-19: 50 ug via INTRAVENOUS

## 2021-11-19 MED ORDER — CEFAZOLIN SODIUM-DEXTROSE 2-4 GM/100ML-% IV SOLN: 2.0000 g | INTRAVENOUS | Status: AC

## 2021-11-19 MED ORDER — FAMOTIDINE 20 MG PO TABS
40.0000 mg | ORAL_TABLET | Freq: Once | ORAL | Status: AC | PRN
  Administered 2021-11-19: 40 mg via ORAL

## 2021-11-19 MED ORDER — CLOPIDOGREL BISULFATE 75 MG PO TABS
75.0000 mg | ORAL_TABLET | Freq: Every day | ORAL | Status: DC
  Administered 2021-11-20: 75 mg via ORAL
  Filled 2021-11-19: qty 1

## 2021-11-19 MED ORDER — MIDAZOLAM HCL 2 MG/ML PO SYRP: 8.0000 mg | ORAL_SOLUTION | Freq: Once | ORAL | Status: DC | PRN

## 2021-11-19 MED ORDER — FENTANYL CITRATE PF 50 MCG/ML IJ SOSY
PREFILLED_SYRINGE | INTRAMUSCULAR | Status: AC
  Filled 2021-11-19: qty 2

## 2021-11-19 MED ORDER — IODIXANOL 320 MG/ML IV SOLN
INTRAVENOUS | Status: DC | PRN
  Administered 2021-11-19: 50 mL via INTRA_ARTERIAL

## 2021-11-19 MED ORDER — METHYLPREDNISOLONE SODIUM SUCC 125 MG IJ SOLR
INTRAMUSCULAR | Status: AC
  Filled 2021-11-19: qty 2

## 2021-11-19 MED ORDER — PHENYLEPHRINE 80 MCG/ML (10ML) SYRINGE FOR IV PUSH (FOR BLOOD PRESSURE SUPPORT)
PREFILLED_SYRINGE | INTRAVENOUS | Status: AC
  Filled 2021-11-19: qty 10

## 2021-11-19 MED ORDER — MAGNESIUM SULFATE 2 GM/50ML IV SOLN
2.0000 g | Freq: Every day | INTRAVENOUS | Status: DC | PRN
  Filled 2021-11-19: qty 50

## 2021-11-19 MED ORDER — ADULT MULTIVITAMIN W/MINERALS CH
1.0000 | ORAL_TABLET | Freq: Every day | ORAL | Status: DC
  Administered 2021-11-20: 1 via ORAL
  Filled 2021-11-19: qty 1

## 2021-11-19 MED ORDER — MIDAZOLAM HCL 2 MG/2ML IJ SOLN
INTRAMUSCULAR | Status: DC | PRN
  Administered 2021-11-19: 1 mg via INTRAVENOUS

## 2021-11-19 MED ORDER — SODIUM CHLORIDE 0.9 % IV SOLN: 500.0000 mL | Freq: Once | INTRAVENOUS | Status: DC | PRN

## 2021-11-19 MED ORDER — PHENOL 1.4 % MT LIQD: 1.0000 | OROMUCOSAL | Status: DC | PRN

## 2021-11-19 MED ORDER — AMLODIPINE BESYLATE 5 MG PO TABS
5.0000 mg | ORAL_TABLET | Freq: Every day | ORAL | Status: DC
  Administered 2021-11-20: 5 mg via ORAL
  Filled 2021-11-19: qty 1

## 2021-11-19 MED ORDER — HYDROCHLOROTHIAZIDE 25 MG PO TABS
25.0000 mg | ORAL_TABLET | Freq: Every day | ORAL | Status: DC
  Administered 2021-11-20: 25 mg via ORAL
  Filled 2021-11-19: qty 1

## 2021-11-19 MED ORDER — HYDRALAZINE HCL 20 MG/ML IJ SOLN: 5.0000 mg | INTRAMUSCULAR | Status: DC | PRN

## 2021-11-19 MED ORDER — ACETAMINOPHEN 325 MG PO TABS: 325.0000 mg | ORAL_TABLET | ORAL | Status: DC | PRN

## 2021-11-19 MED ORDER — ALLOPURINOL 100 MG PO TABS
100.0000 mg | ORAL_TABLET | Freq: Every day | ORAL | Status: DC
Start: 2021-11-20 — End: 2021-11-20
  Administered 2021-11-20: 100 mg via ORAL
  Filled 2021-11-19: qty 1

## 2021-11-19 MED ORDER — ATROPINE SULFATE 1 MG/10ML IJ SOSY
PREFILLED_SYRINGE | INTRAMUSCULAR | Status: AC
Start: 2021-11-19 — End: 2021-11-19
  Filled 2021-11-19: qty 20

## 2021-11-19 MED ORDER — DIPHENHYDRAMINE HCL 50 MG/ML IJ SOLN
INTRAMUSCULAR | Status: AC
  Filled 2021-11-19: qty 1

## 2021-11-19 MED ORDER — HYDROMORPHONE HCL 1 MG/ML IJ SOLN: 1.0000 mg | Freq: Once | INTRAMUSCULAR | Status: DC | PRN

## 2021-11-19 MED ORDER — MORPHINE SULFATE (PF) 4 MG/ML IV SOLN: 2.0000 mg | INTRAVENOUS | Status: DC | PRN

## 2021-11-19 MED ORDER — ALUM & MAG HYDROXIDE-SIMETH 200-200-20 MG/5ML PO SUSP: 15.0000 mL | ORAL | Status: DC | PRN

## 2021-11-19 MED ORDER — SODIUM CHLORIDE 0.9 % IV SOLN
INTRAVENOUS | Status: DC
  Administered 2021-11-19: 1000 mL via INTRAVENOUS

## 2021-11-19 MED ORDER — DOCUSATE SODIUM 100 MG PO CAPS
100.0000 mg | ORAL_CAPSULE | Freq: Two times a day (BID) | ORAL | Status: DC | PRN
  Administered 2021-11-20: 100 mg via ORAL
  Filled 2021-11-19 (×2): qty 1

## 2021-11-19 MED ORDER — VITAMIN D 25 MCG (1000 UNIT) PO TABS
1000.0000 [IU] | ORAL_TABLET | Freq: Every day | ORAL | Status: DC
  Administered 2021-11-20: 1000 [IU] via ORAL
  Filled 2021-11-19: qty 1

## 2021-11-19 MED ORDER — BENAZEPRIL HCL 20 MG PO TABS
20.0000 mg | ORAL_TABLET | Freq: Every day | ORAL | Status: DC
  Administered 2021-11-20: 20 mg via ORAL
  Filled 2021-11-19: qty 1

## 2021-11-19 MED ORDER — DIPHENHYDRAMINE HCL 50 MG/ML IJ SOLN
50.0000 mg | Freq: Once | INTRAMUSCULAR | Status: AC | PRN
  Administered 2021-11-19: 50 mg via INTRAVENOUS

## 2021-11-19 MED ORDER — ACETAMINOPHEN 325 MG RE SUPP
325.0000 mg | RECTAL | Status: DC | PRN
  Filled 2021-11-19: qty 2

## 2021-11-19 MED ORDER — SODIUM CHLORIDE 0.9 % IV BOLUS
INTRAVENOUS | Status: DC | PRN
  Administered 2021-11-19: 999 mL/h via INTRAVENOUS

## 2021-11-19 MED ORDER — FAMOTIDINE IN NACL 20-0.9 MG/50ML-% IV SOLN
20.0000 mg | Freq: Two times a day (BID) | INTRAVENOUS | Status: DC
  Administered 2021-11-19 (×2): 20 mg via INTRAVENOUS
  Filled 2021-11-19 (×2): qty 50

## 2021-11-19 MED ORDER — MIDAZOLAM HCL 2 MG/2ML IJ SOLN
INTRAMUSCULAR | Status: AC
  Filled 2021-11-19: qty 4

## 2021-11-19 MED ORDER — PHENYLEPHRINE HCL-NACL 20-0.9 MG/250ML-% IV SOLN
INTRAVENOUS | Status: AC
  Administered 2021-11-19: 20 ug
  Filled 2021-11-19: qty 250

## 2021-11-19 MED ORDER — LABETALOL HCL 5 MG/ML IV SOLN: 10.0000 mg | INTRAVENOUS | Status: DC | PRN

## 2021-11-19 MED ORDER — CEFAZOLIN SODIUM-DEXTROSE 2-4 GM/100ML-% IV SOLN
INTRAVENOUS | Status: AC
  Administered 2021-11-19: 2 g via INTRAVENOUS
  Filled 2021-11-19: qty 100

## 2021-11-19 MED ORDER — ATROPINE SULFATE 1 MG/ML IV SOLN
INTRAVENOUS | Status: DC | PRN
  Administered 2021-11-19: 1 mg via INTRAVENOUS

## 2021-11-19 MED ORDER — ONDANSETRON HCL 4 MG/2ML IJ SOLN: 4.0000 mg | Freq: Four times a day (QID) | INTRAMUSCULAR | Status: DC | PRN

## 2021-11-19 MED ORDER — HEPARIN SODIUM (PORCINE) 1000 UNIT/ML IJ SOLN
INTRAMUSCULAR | Status: DC | PRN
  Administered 2021-11-19: 7000 [IU] via INTRAVENOUS

## 2021-11-19 MED ORDER — TIMOLOL MALEATE 0.5 % OP SOLN
1.0000 [drp] | Freq: Two times a day (BID) | OPHTHALMIC | Status: DC
  Administered 2021-11-19 – 2021-11-20 (×2): 1 [drp] via OPHTHALMIC
  Filled 2021-11-19: qty 5

## 2021-11-19 MED ORDER — METOPROLOL TARTRATE 5 MG/5ML IV SOLN: 2.0000 mg | INTRAVENOUS | Status: DC | PRN

## 2021-11-19 MED ORDER — FAMOTIDINE 20 MG PO TABS
ORAL_TABLET | ORAL | Status: AC
  Filled 2021-11-19: qty 2

## 2021-11-19 MED ORDER — LORATADINE 10 MG PO TABS: 10.0000 mg | ORAL_TABLET | Freq: Every day | ORAL | Status: DC

## 2021-11-19 MED ORDER — GUAIFENESIN-DM 100-10 MG/5ML PO SYRP
15.0000 mL | ORAL_SOLUTION | ORAL | Status: DC | PRN
  Filled 2021-11-19: qty 15

## 2021-11-19 MED ORDER — CEFAZOLIN SODIUM-DEXTROSE 2-4 GM/100ML-% IV SOLN
2.0000 g | Freq: Three times a day (TID) | INTRAVENOUS | Status: AC
  Administered 2021-11-19 – 2021-11-20 (×2): 2 g via INTRAVENOUS
  Filled 2021-11-19 (×2): qty 100

## 2021-11-19 MED ORDER — OXYCODONE-ACETAMINOPHEN 5-325 MG PO TABS: 1.0000 | ORAL_TABLET | ORAL | Status: DC | PRN

## 2021-11-19 MED ORDER — ONDANSETRON HCL 4 MG/2ML IJ SOLN
4.0000 mg | Freq: Four times a day (QID) | INTRAMUSCULAR | Status: DC | PRN
Start: 2021-11-19 — End: 2021-11-19

## 2021-11-19 MED ORDER — SODIUM CHLORIDE 0.9 % IV SOLN: INTRAVENOUS | Status: DC

## 2021-11-19 MED ORDER — METHYLPREDNISOLONE SODIUM SUCC 125 MG IJ SOLR
125.0000 mg | Freq: Once | INTRAMUSCULAR | Status: AC | PRN
  Administered 2021-11-19: 125 mg via INTRAVENOUS

## 2021-11-19 MED ORDER — POTASSIUM CHLORIDE CRYS ER 20 MEQ PO TBCR: 20.0000 meq | EXTENDED_RELEASE_TABLET | Freq: Every day | ORAL | Status: DC | PRN

## 2021-11-19 SURGICAL SUPPLY — 22 items
BALLN VIATRAC 5X20X135 (BALLOONS) ×2
BALLOON VIATRAC 5X20X135 (BALLOONS) IMPLANT
CATH ANGIO 5F PIGTAIL 100CM (CATHETERS) ×1 IMPLANT
CATH BEACON 5 .035 100 H1 TIP (CATHETERS) ×1 IMPLANT
COVER DRAPE FLUORO 36X44 (DRAPES) ×1 IMPLANT
COVER PROBE U/S 5X48 (MISCELLANEOUS) ×1 IMPLANT
DEVICE EMBOSHIELD NAV6 4.0-7.0 (FILTER) ×1 IMPLANT
DEVICE SAFEGUARD 24CM (GAUZE/BANDAGES/DRESSINGS) ×1 IMPLANT
DEVICE STARCLOSE SE CLOSURE (Vascular Products) ×1 IMPLANT
DEVICE TORQUE .025-.038 (MISCELLANEOUS) ×1 IMPLANT
GLIDEWIRE ANGLED SS 035X260CM (WIRE) ×1 IMPLANT
GUIDEWIRE VASC STIFF .038X260 (WIRE) ×1 IMPLANT
KIT CAROTID MANIFOLD (MISCELLANEOUS) ×1 IMPLANT
KIT ENCORE 26 ADVANTAGE (KITS) ×1 IMPLANT
PACK ANGIOGRAPHY (CUSTOM PROCEDURE TRAY) ×2 IMPLANT
SHEATH BRITE TIP 5FRX11 (SHEATH) ×1 IMPLANT
SHEATH NEURON MAX 6FR 80CM (SHEATH) ×1 IMPLANT
SHEATH SHUTTLE 6FRX80 (SHEATH) ×2 IMPLANT
STENT XACT CAR 9-7X30X136 (Permanent Stent) ×1 IMPLANT
SYR MEDRAD MARK 7 150ML (SYRINGE) ×1 IMPLANT
TUBING CONTRAST HIGH PRESS 72 (TUBING) ×1 IMPLANT
WIRE GUIDERIGHT .035X150 (WIRE) ×1 IMPLANT

## 2021-11-19 NOTE — Interval H&P Note (Signed)
History and Physical Interval Note: ? ?11/19/2021 ?8:23 AM ? ?Betty Atkins  has presented today for surgery, with the diagnosis of R Carotid Stent  ABBOTT   Carotid artery stenosis.  The various methods of treatment have been discussed with the patient and family. After consideration of risks, benefits and other options for treatment, the patient has consented to  Procedure(s): ?CAROTID PTA/STENT INTERVENTION (Right) as a surgical intervention.  The patient's history has been reviewed, patient examined, no change in status, stable for surgery.  I have reviewed the patient's chart and labs.  Questions were answered to the patient's satisfaction.   ? ? ?Leotis Pain ? ? ?

## 2021-11-19 NOTE — Op Note (Signed)
? ? ?OPERATIVE NOTE ?DATE: 11/19/2021 ? ?PROCEDURE: ? Ultrasound guidance for vascular access right femoral artery ? Placement of a 9 mm proximal, 7 mm distal, 3 cm long exact stent with the use of the NAV-6 embolic protection device in the right carotid artery ? ?PRE-OPERATIVE DIAGNOSIS: 1.  High-grade right carotid artery stenosis. ?2.  Right hemispheric stroke within the last 30 days ? ?POST-OPERATIVE DIAGNOSIS:  Same as above ? ?SURGEON: Leotis Pain, MD ? ?ASSISTANT(S): None ? ?ANESTHESIA: local/MCS ? ?ESTIMATED BLOOD LOSS: 10 cc ? ?CONTRAST: 50 cc ? ?FLUORO TIME: 5.3 minutes ? ?MODERATE CONSCIOUS SEDATION TIME:  Approximately 45 minutes using 1 mg of Versed and 50 mcg of Fentanyl ? ?FINDING(S): ?1.   Approximately 90% right carotid artery stenosis ? ?SPECIMEN(S):   none ? ?INDICATIONS:   ?Patient is a 88 y.o. female who presents with a recent stroke and high-grade right carotid artery stenosis.  The patient has a relatively high lesion and advanced age and carotid artery stenting was felt to be preferred to endarterectomy for that reason.  Risks and benefits were discussed and informed consent was obtained.  ? ?DESCRIPTION: ?After obtaining full informed written consent, the patient was brought back to the vascular suite and placed supine upon the table.  The patient received IV antibiotics prior to induction. Moderate conscious sedation was administered during a face to face encounter with the patient throughout the procedure with my supervision of the RN administering medicines and monitoring the patients vital signs and mental status throughout from the start of the procedure until the patient was taken to the recovery room.  After obtaining adequate anesthesia, the patient was prepped and draped in the standard fashion.   The right femoral artery was visualized with ultrasound and found to be widely patent. It was then accessed under direct ultrasound guidance without difficulty with a Seldinger needle. A  permanent image was recorded. A J-wire was placed and we then placed a 6 French sheath. The patient was then heparinized and a total of 7000 units of intravenous heparin were given and an ACT was checked to confirm successful anticoagulation. A pigtail catheter was then placed into the ascending aorta. This showed normal origins of the great vessels without proximal stenosis. I then selectively cannulated the innominate without difficulty with a headhunter catheter and advanced into the mid right common carotid artery.  Cervical and cerebral carotid angiography was then performed. There were no obvious intracranial filling defects with relatively diminutive anterior cerebral flow. The carotid bifurcation demonstrated a high-grade stenosis that was at least moderately calcific in the proximal internal carotid artery with about a 90% stenosis roughly 1-1/2-2 cm above the bifurcation.  I then advanced into the external carotid artery with a Glidewire and the headhunter catheter and then exchanged for the Amplatz Super Stiff wire. Over the Amplatz Super Stiff wire, a 6 [place] shuttle sheath was placed into the mid common carotid artery. I then used the NAV-6  Embolic protection device and crossed the lesion and parked this in the distal internal carotid artery at the base of the skull.  I then selected a 9 mm proximal, 7 mm distal, 3 cm long exact stent. This was deployed across the lesion encompassing it in its entirety all within the internal carotid artery. A 5 mm diameter by 2 cm length balloon was used to post dilate the stent. Only about a 15-20% residual stenosis was present after angioplasty. Completion angiogram showed normal intracranial filling without new defects. At this point  I elected to terminate the procedure. The sheath was removed and StarClose closure device was deployed in the right femoral artery with excellent hemostatic result. The patient was taken to the recovery room in stable condition having  tolerated the procedure well. ? ?COMPLICATIONS: none ? ?CONDITION: stable ? ?Leotis Pain ?11/19/2021 ?10:29 AM ? ? ?This note was created with Dragon Medical transcription system. Any errors in dictation are purely unintentional.  ?

## 2021-11-19 NOTE — Progress Notes (Signed)
?   11/19/21 2120  ?Clinical Encounter Type  ?Visited With Patient  ?Visit Type Initial  ?Referral From Patient  ?Consult/Referral To Chaplain  ?Spiritual Encounters  ?Spiritual Needs Prayer  ? ?Chaplain Deloria Lair was present on unit and checked on Pt's well-being. Pt offered that she hoped to rest and shared that earlier had felt somewhat anxious. Chaplain B offered compassionate presence and normalization of emotions. Chaplain B offered a brief prayer for Pt's restful night and for calm. ?

## 2021-11-20 LAB — BASIC METABOLIC PANEL
Anion gap: 11 (ref 5–15)
BUN: 14 mg/dL (ref 8–23)
CO2: 20 mmol/L — ABNORMAL LOW (ref 22–32)
Calcium: 8.4 mg/dL — ABNORMAL LOW (ref 8.9–10.3)
Chloride: 99 mmol/L (ref 98–111)
Creatinine, Ser: 1.2 mg/dL — ABNORMAL HIGH (ref 0.44–1.00)
GFR, Estimated: 44 mL/min — ABNORMAL LOW (ref >60)
Glucose, Bld: 143 mg/dL — ABNORMAL HIGH (ref 70–99)
Potassium: 3.3 mmol/L — ABNORMAL LOW (ref 3.5–5.1)
Sodium: 130 mmol/L — ABNORMAL LOW (ref 135–145)

## 2021-11-20 LAB — CBC
HCT: 35.6 % — ABNORMAL LOW (ref 36.0–46.0)
Hemoglobin: 12 g/dL (ref 12.0–15.0)
MCH: 29.4 pg (ref 26.0–34.0)
MCHC: 33.7 g/dL (ref 30.0–36.0)
MCV: 87.3 fL (ref 80.0–100.0)
Platelets: 274 10*3/uL (ref 150–400)
RBC: 4.08 MIL/uL (ref 3.87–5.11)
RDW: 12.5 % (ref 11.5–15.5)
WBC: 13.5 10*3/uL — ABNORMAL HIGH (ref 4.0–10.5)
nRBC: 0 % (ref 0.0–0.2)

## 2021-11-20 MED ORDER — FAMOTIDINE 20 MG PO TABS
20.0000 mg | ORAL_TABLET | Freq: Two times a day (BID) | ORAL | Status: DC
Start: 2021-11-20 — End: 2021-11-20
  Administered 2021-11-20: 20 mg via ORAL
  Filled 2021-11-20: qty 1

## 2021-11-20 MED ORDER — OXYCODONE-ACETAMINOPHEN 5-325 MG PO TABS: 1.0000 | ORAL_TABLET | Freq: Four times a day (QID) | ORAL | 0 refills | Status: AC | PRN

## 2021-11-20 NOTE — Plan of Care (Signed)

## 2021-11-20 NOTE — Progress Notes (Signed)
PHARMACIST - PHYSICIAN COMMUNICATION ? ?CONCERNING: IV to Oral Route Change Policy ? ?RECOMMENDATION: ?This patient is receiving famotidine by the intravenous route.  Based on criteria approved by the Pharmacy and Therapeutics Committee, the intravenous medication(s) is/are being converted to the equivalent oral dose form(s). ? ? ?DESCRIPTION: ?These criteria include: ?The patient is eating (either orally or via tube) and/or has been taking other orally administered medications for a least 24 hours ?The patient has no evidence of active gastrointestinal bleeding or impaired GI absorption (gastrectomy, short bowel, patient on TNA or NPO). ? ?If you have questions about this conversion, please contact the Pharmacy Department  ? ?Benita Gutter, RPH ?11/20/2021 8:14 AM  ?

## 2021-11-20 NOTE — Discharge Summary (Signed)
?[facility]    ?Discharge Summary ? ? ? ?Patient ID:  ?Betty Atkins ?MRN: [id number] ?DOB/AGE: Feb 02, 1933 88 y.o. ? ?Admit date: 11/19/2021 ?Discharge date: 11/20/2021 ?Date of Surgery: 11/19/2021 ?Surgeon: Surgeon(s): ?Algernon Huxley, MD ? ?Admission Diagnosis: ?Carotid stenosis, symptomatic, with infarction ([place]) [I63.239] ? ?Discharge Diagnoses:  ?Carotid stenosis, symptomatic, with infarction ([place]) [I63.239] ? ?Secondary Diagnoses: ?Past Medical History:  ?Diagnosis Date  ? Anemia   ? Arthritis   ? degenerative  ? Dyspnea   ? Gout involving toe   ? Hyperlipidemia   ? Hypertension   ? Pre-diabetes   ? ? ?Procedure(s): ?CAROTID PTA/STENT INTERVENTION ? ?Discharged Condition: good ? ?HPI:  ?Betty Atkins is an 88 year old female who had a stroke several weeks ago and was found to have a critical right carotid stenosis.  The patient underwent stent placement of her right ICA on the 11/19/2021.  Overall the patient did well postoperatively however she did have some bleeding but that was rectified by the morning.  She was able to get out of of bed and ambulate with no issues. ? ?Hospital Course:  ?Betty Atkins is a 88 y.o. female is S/P Right carotid stent placement ?Procedure(s): ?CAROTID PTA/STENT INTERVENTION ?Extubated: POD # 0 ?Physical exam: Neurologically intact, groin clean dry intact with no hematoma, 2+ pedal pulses bilaterally ?Post-op wounds clean, dry, intact or healing well ?Pt. Ambulating, voiding and taking PO diet without difficulty. ?Pt pain controlled with PO pain meds. ?Labs as below ?Complications:none ? ?Consults:  ? ? ?Significant Diagnostic Studies: ?CBC ?Lab Results  ?Component Value Date  ? WBC 13.5 (H) 11/20/2021  ? HGB 12.0 11/20/2021  ? HCT 35.6 (L) 11/20/2021  ? MCV 87.3 11/20/2021  ? PLT 274 11/20/2021  ? ? ?BMET ?   ?Component Value Date/Time  ? NA 130 (L) 11/20/2021 0335  ? NA 137 06/28/2012 0922  ? K 3.3 (L) 11/20/2021 0335  ? K 4.5 06/28/2012 0922  ? CL 99  11/20/2021 0335  ? CL 107 06/28/2012 0922  ? CO2 20 (L) 11/20/2021 0335  ? CO2 24 06/28/2012 0922  ? GLUCOSE 143 (H) 11/20/2021 0335  ? GLUCOSE 101 (H) 06/28/2012 4742  ? BUN 14 11/20/2021 0335  ? BUN 14 06/28/2012 0922  ? CREATININE 1.20 (H) 11/20/2021 0335  ? CREATININE 1.12 06/28/2012 0922  ? CALCIUM 8.4 (L) 11/20/2021 0335  ? CALCIUM 9.0 06/28/2012 0922  ? GFRNONAA 44 (L) 11/20/2021 0335  ? GFRNONAA 47 (L) 06/28/2012 5956  ? GFRAA 55 (L) 06/28/2012 3875  ? ?COAG ?Lab Results  ?Component Value Date  ? INR 1.0 10/22/2021  ? ? ? ?Disposition:  ?Discharge to :Home ? ?Allergies as of 11/20/2021   ? ?   Reactions  ? Iodinated Contrast Media Hives, Itching  ? I gave the patient 168m of isovue 300. As i was getting her up after the scan she was itching the Left side of her face. She had developed two hives on the left side of her face. Dr. SClovis Rileycame and evaluated her. We gave her '50mg'$  of Benadryl and her symptoms improved. SPM ?I gave the patient 1035mof isovue 300. As i was getting her up after the scan she was itching the Left side of her face. She had developed two hives on the left side of her face. Dr. StClovis Rileyame and evaluated her. We gave her '50mg'$  of Benadryl and her symptoms improved. SPM ?I gave the patient 10095mf isovue 300. As i  was getting her up after the scan she was itching the Left side of her face. She had developed two hives on the left side of her face. Dr. Clovis Riley came and evaluated her. We gave her '50mg'$  of Benadryl and her symptoms improved. SPM ?I gave the patient 162m of isovue 300. As i was getting her up after the scan she was itching the Left side of her face. She had developed two hives on the left side of her face. Dr. SClovis Rileycame and evaluated her. We gave her '50mg'$  of Benadryl and her symptoms improved. SPM ?I gave the patient 1033mof isovue 300. As i was getting her up after the scan she was itching the Left side of her face. She had developed two hives on the left side of her face.  Dr. StClovis Rileyame and evaluated her. We gave her '50mg'$  of Benadryl and her symptoms improved. SPM ?I gave the patient 10042mf isovue 300. As i was getting her up after the scan she was itching the Left side of her face. She had developed two hives on the left side of her face. Dr. StrClovis Rileyme and evaluated her. We gave her '50mg'$  of Benadryl and her symptoms improved. SPM  ? Metrizamide Hives, Itching  ? I gave the patient 100m19m isovue 300. As i was getting her up after the scan she was itching the Left side of her face. She had developed two hives on the left side of her face. Dr. StroClovis Rileye and evaluated her. We gave her '50mg'$  of Benadryl and her symptoms improved. SPM  ? ?  ? ?  ?Medication List  ?  ? ?TAKE these medications   ? ?allopurinol 100 MG tablet ?Commonly known as: ZYLOPRIM ?Take 100 mg by mouth daily. ?  ?amLODipine-benazepril 5-20 MG capsule ?Commonly known as: LOTREL ?Take 1 capsule by mouth daily. ?  ?aspirin 81 MG EC tablet ?Take 1 tablet (81 mg total) by mouth daily. Swallow whole. ?  ?atorvastatin 80 MG tablet ?Commonly known as: LIPITOR ?Take 1 tablet (80 mg total) by mouth daily. ?  ?CALTRATE PLUS PO ?Take 1 tablet by mouth daily. ?  ?cetirizine 10 MG tablet ?Commonly known as: ZYRTEC ?Take by mouth as needed. ?  ?Ciclopirox 1 % shampoo ?APP ON THE SKIN UTD ?  ?clobetasol ointment 0.05 % ?Commonly known as: TEMOVATE ?Apply to affected area every night for 4 weeks, then every other day for 4 weeks and then twice a week for 4 weeks or until resolution. ?  ?clopidogrel 75 MG tablet ?Commonly known as: PLAVIX ?Take 1 tablet (75 mg total) by mouth daily. ?  ?docusate sodium 100 MG capsule ?Commonly known as: COLACE ?Take 1 capsule (100 mg total) by mouth 2 (two) times daily as needed for mild constipation. ?  ?hydrochlorothiazide 25 MG tablet ?Commonly known as: HYDRODIURIL ?Take 25 mg by mouth daily. ?  ?multivitamin tablet ?Take 1 tablet by mouth daily. ?  ?oxyCODONE-acetaminophen 5-325 MG  tablet ?Commonly known as: PERCOCET/ROXICET ?Take 1 tablet by mouth every 6 (six) hours as needed for moderate pain. ?  ?timolol 0.5 % ophthalmic solution ?Commonly known as: TIMOPTIC ?Place 1 drop into both eyes 2 (two) times daily. ?  ?Vitamin D3 25 MCG tablet ?Commonly known as: Vitamin D ?Take 1,000 Units by mouth daily. ?  ? ?  ? ?Verbal and written Discharge instructions given to the patient. Wound care per Discharge AVS ? Follow-up Information   ? ? BrowKris Hartmann  NP Follow up in 3 week(s).   ?Specialty: Vascular Surgery ?Contact information: ?[address] ?[place] [address] ?[phone] ? ? ?  ?  ? ?  ?  ? ?  ? ? ?Signed: ?Kris Hartmann, NP ? ?11/20/2021, 10:43 AM ? ?  ? ?

## 2021-11-20 NOTE — Progress Notes (Signed)
PIVs removed.  Patient and daughter educated on AVS including discharge instructions, medication admin, new Rx and where to get, and follow up appointment.  Patient was assisted by daughter to get dressed.  Right groin site remains clean dry and intact with no complaints of pain.  Pateint taken out to daughters car via wheelchair.  All belongings packed and taken with patient. ?

## 2021-11-20 NOTE — Progress Notes (Signed)
Patient is alert and oriented. She states that she feels well. Upon assessment of the PAD to the right femoral artery, fresh blood was noted. 10cc of air instilled into device and patient tolerated well. No bleeding noted at this time. BP stable. Pulses 2+ in bilateral lower extremities. Call bell in reach. Will continue to monitor.  ?

## 2021-12-04 ENCOUNTER — Encounter (INDEPENDENT_AMBULATORY_CARE_PROVIDER_SITE_OTHER): Payer: Medicare Other | Admitting: Vascular Surgery

## 2021-12-11 ENCOUNTER — Ambulatory Visit (INDEPENDENT_AMBULATORY_CARE_PROVIDER_SITE_OTHER): Payer: Medicare Other | Admitting: Nurse Practitioner

## 2021-12-11 ENCOUNTER — Encounter (INDEPENDENT_AMBULATORY_CARE_PROVIDER_SITE_OTHER): Payer: Self-pay | Admitting: Nurse Practitioner

## 2021-12-11 VITALS — BP 150/82 | HR 89 | Resp 17 | Ht 67.0 in | Wt 222.0 lb

## 2021-12-11 DIAGNOSIS — I63239 Cerebral infarction due to unspecified occlusion or stenosis of unspecified carotid arteries: Secondary | ICD-10-CM

## 2021-12-11 DIAGNOSIS — I10 Essential (primary) hypertension: Secondary | ICD-10-CM

## 2021-12-17 ENCOUNTER — Encounter (INDEPENDENT_AMBULATORY_CARE_PROVIDER_SITE_OTHER): Payer: Self-pay | Admitting: Nurse Practitioner

## 2021-12-17 NOTE — Progress Notes (Signed)
Subjective:    Patient ID: Betty Atkins, female    DOB: 12-Sep-1932, 88 y.o.   MRN: [id number] No chief complaint on file.   Betty Atkins is an 88 year old female who recently had a right carotid stent placement post notes significant stenosis after recent stroke.  The patient has not had no significant residual symptoms post stroke although she notes she has some weakness at times but overall nothing significant.  She denies any new symptoms post stent placement.  She denies any issues with her wound and it is currently clean dry and intact.   Review of Systems  Neurological:  Negative for dizziness and numbness.  All other systems reviewed and are negative.     Objective:   Physical Exam Vitals reviewed.  HENT:     Head: Normocephalic.  Cardiovascular:     Rate and Rhythm: Normal rate.     Pulses: Normal pulses.  Pulmonary:     Effort: Pulmonary effort is normal.  Skin:    General: Skin is warm and dry.  Neurological:     Mental Status: She is alert and oriented to person, place, and time.  Psychiatric:        Mood and Affect: Mood normal.        Behavior: Behavior normal.        Thought Content: Thought content normal.        Judgment: Judgment normal.    BP (!) 150/82 (BP Location: Left Arm)   Pulse 89   Resp 17   Ht '5\' 7"'$  (1.702 m)   Wt 222 lb (100.7 kg)   BMI 34.77 kg/m   Past Medical History:  Diagnosis Date   Anemia    Arthritis    degenerative   Dyspnea    Gout involving toe    Hyperlipidemia    Hypertension    Pre-diabetes     Social History   Socioeconomic History   Marital status: Widowed    Spouse name: Not on file   Number of children: Not on file   Years of education: Not on file   Highest education level: Not on file  Occupational History   Not on file  Tobacco Use   Smoking status: Never   Smokeless tobacco: Never  Vaping Use   Vaping Use: Never used  Substance and Sexual Activity   Alcohol use: No   Drug use: No    Sexual activity: Not Currently    Birth control/protection: Post-menopausal  Other Topics Concern   Not on file  Social History Narrative   Not on file   Social Determinants of Health   Financial Resource Strain: Not on file  Food Insecurity: Not on file  Transportation Needs: Not on file  Physical Activity: Not on file  Stress: Not on file  Social Connections: Not on file  Intimate Partner Violence: Not on file    Past Surgical History:  Procedure Laterality Date   ABDOMINAL HYSTERECTOMY     CAROTID PTA/STENT INTERVENTION Right 11/19/2021   Procedure: CAROTID PTA/STENT INTERVENTION;  Surgeon: Algernon Huxley, MD;  Location: [facility];  Service: Cardiovascular;  Laterality: Right;   COLONOSCOPY WITH PROPOFOL N/A 05/02/2015   Procedure: COLONOSCOPY WITH PROPOFOL;  Surgeon: Lollie Sails, MD;  Location: [facility];  Service: Endoscopy;  Laterality: N/A;   JOINT REPLACEMENT     right total hip    Family History  Problem Relation Age of Onset   Breast cancer Sister 29  Diabetes Sister     Allergies  Allergen Reactions   Iodinated Contrast Media Hives and Itching    I gave the patient 134m of isovue 300. As i was getting her up after the scan she was itching the Left side of her face. She had developed two hives on the left side of her face. Dr. SClovis Rileycame and evaluated her. We gave her '50mg'$  of Benadryl and her symptoms improved. SPM I gave the patient 1070mof isovue 300. As i was getting her up after the scan she was itching the Left side of her face. She had developed two hives on the left side of her face. Dr. StClovis Rileyame and evaluated her. We gave her '50mg'$  of Benadryl and her symptoms improved. SPM I gave the patient 10013mf isovue 300. As i was getting her up after the scan she was itching the Left side of her face. She had developed two hives on the left side of her face. Dr. StrClovis Rileyme and evaluated her. We gave her '50mg'$  of Benadryl and her symptoms  improved. SPM  I gave the patient 100m29m isovue 300. As i was getting her up after the scan she was itching the Left side of her face. She had developed two hives on the left side of her face. Dr. StroClovis Rileye and evaluated her. We gave her '50mg'$  of Benadryl and her symptoms improved. SPM I gave the patient 100ml74misovue 300. As i was getting her up after the scan she was itching the Left side of her face. She had developed two hives on the left side of her face. Dr. StrouClovis Riley and evaluated her. We gave her '50mg'$  of Benadryl and her symptoms improved. SPM I gave the patient 100ml 68msovue 300. As i was getting her up after the scan she was itching the Left side of her face. She had developed two hives on the left side of her face. Dr. StroudClovis Rileyand evaluated her. We gave her '50mg'$  of Benadryl and her symptoms improved. SPM    Metrizamide Hives and Itching    I gave the patient 100ml o62movue 300. As i was getting her up after the scan she was itching the Left side of her face. She had developed two hives on the left side of her face. Dr. Stroud Clovis Rileynd evaluated her. We gave her '50mg'$  of Benadryl and her symptoms improved. SPM       Latest Ref Rng & Units 11/20/2021    3:35 AM 10/24/2021    5:34 AM 10/23/2021    4:54 AM  CBC  WBC 4.0 - 10.5 K/uL 13.5   7.5   8.4    Hemoglobin 12.0 - 15.0 g/dL 12.0   13.7   13.3    Hematocrit 36.0 - 46.0 % 35.6   41.0   40.1    Platelets 150 - 400 K/uL 274   272   260        CMP     Component Value Date/Time   NA 130 (L) 11/20/2021 0335   NA 137 06/28/2012 0922   K 3.3 (L) 11/20/2021 0335   K 4.5 06/28/2012 0922   CL 99 11/20/2021 0335   CL 107 06/28/2012 0922   CO2 20 (L) 11/20/2021 0335   CO2 24 06/28/2012 0922   GLUCOSE 143 (H) 11/20/2021 0335   GLUCOSE 101 (H) 06/28/2012 0922   BUN 14 11/20/2021 0335   BUN 14 06/28/2012 09221324  CREATININE 1.20 (H) 11/20/2021 0335   CREATININE 1.12 06/28/2012 0922   CALCIUM 8.4 (L) 11/20/2021 0335   CALCIUM  9.0 06/28/2012 0922   PROT 8.0 10/22/2021 1150   ALBUMIN 4.2 10/22/2021 1150   AST 26 10/22/2021 1150   ALT 25 10/22/2021 1150   ALKPHOS 62 10/22/2021 1150   BILITOT 0.5 10/22/2021 1150   GFRNONAA 44 (L) 11/20/2021 0335   GFRNONAA 47 (L) 06/28/2012 0922   GFRAA 55 (L) 06/28/2012 0922     No results found.     Assessment & Plan:   1. Carotid stenosis, symptomatic, with infarction [facility]) Patient is doing well post carotid stent placement.  No neurological deficits.  Patient will return in 6 weeks for noninvasive studies.  2. Essential hypertension Continue antihypertensive medications as already ordered, these medications have been reviewed and there are no changes at this time.    Current Outpatient Medications on File Prior to Visit  Medication Sig Dispense Refill   allopurinol (ZYLOPRIM) 100 MG tablet Take 100 mg by mouth daily.     amLODipine-benazepril (LOTREL) 5-20 MG capsule Take 1 capsule by mouth daily.     aspirin EC 81 MG EC tablet Take 1 tablet (81 mg total) by mouth daily. Swallow whole. 30 tablet 11   atorvastatin (LIPITOR) 80 MG tablet Take 1 tablet (80 mg total) by mouth daily. 30 tablet 0   Calcium Carbonate-Vit D-Min (CALTRATE PLUS PO) Take 1 tablet by mouth daily.     cetirizine (ZYRTEC) 10 MG tablet Take by mouth as needed.     Ciclopirox 1 % shampoo APP ON THE SKIN UTD     clobetasol ointment (TEMOVATE) 0.05 % Apply to affected area every night for 4 weeks, then every other day for 4 weeks and then twice a week for 4 weeks or until resolution. 30 g 5   clopidogrel (PLAVIX) 75 MG tablet Take 1 tablet (75 mg total) by mouth daily. 30 tablet 2   docusate sodium (COLACE) 100 MG capsule Take 1 capsule (100 mg total) by mouth 2 (two) times daily as needed for mild constipation. 10 capsule 0   hydrochlorothiazide (HYDRODIURIL) 25 MG tablet Take 25 mg by mouth daily.     Multiple Vitamin (MULTIVITAMIN) tablet Take 1 tablet by mouth daily.     oxyCODONE-acetaminophen  (PERCOCET/ROXICET) 5-325 MG tablet Take 1 tablet by mouth every 6 (six) hours as needed for moderate pain. 30 tablet 0   timolol (TIMOPTIC) 0.5 % ophthalmic solution Place 1 drop into both eyes 2 (two) times daily.     Vitamin D3 (VITAMIN D) 25 MCG tablet Take 1,000 Units by mouth daily.     No current facility-administered medications on file prior to visit.    There are no Patient Instructions on file for this visit. No follow-ups on file.   Kris Hartmann, NP

## 2021-12-18 ENCOUNTER — Encounter (INDEPENDENT_AMBULATORY_CARE_PROVIDER_SITE_OTHER): Payer: Medicare Other | Admitting: Vascular Surgery

## 2022-01-09 ENCOUNTER — Ambulatory Visit: Admit: 2022-01-09 | Payer: Medicare Other | Admitting: Ophthalmology

## 2022-01-09 SURGERY — PHACOEMULSIFICATION, CATARACT, WITH IOL INSERTION
Anesthesia: Topical | Laterality: Left

## 2022-01-15 ENCOUNTER — Other Ambulatory Visit (INDEPENDENT_AMBULATORY_CARE_PROVIDER_SITE_OTHER): Payer: Self-pay | Admitting: Vascular Surgery

## 2022-01-15 DIAGNOSIS — I6521 Occlusion and stenosis of right carotid artery: Secondary | ICD-10-CM

## 2022-01-16 ENCOUNTER — Ambulatory Visit (INDEPENDENT_AMBULATORY_CARE_PROVIDER_SITE_OTHER): Payer: Medicare Other

## 2022-01-16 ENCOUNTER — Ambulatory Visit (INDEPENDENT_AMBULATORY_CARE_PROVIDER_SITE_OTHER): Payer: Medicare Other | Admitting: Nurse Practitioner

## 2022-01-16 ENCOUNTER — Encounter (INDEPENDENT_AMBULATORY_CARE_PROVIDER_SITE_OTHER): Payer: Self-pay | Admitting: Nurse Practitioner

## 2022-01-16 VITALS — BP 143/85 | HR 94 | Resp 16 | Wt 223.0 lb

## 2022-01-16 DIAGNOSIS — E782 Mixed hyperlipidemia: Secondary | ICD-10-CM

## 2022-01-16 DIAGNOSIS — E119 Type 2 diabetes mellitus without complications: Secondary | ICD-10-CM

## 2022-01-16 DIAGNOSIS — I63239 Cerebral infarction due to unspecified occlusion or stenosis of unspecified carotid arteries: Secondary | ICD-10-CM

## 2022-01-16 DIAGNOSIS — I6521 Occlusion and stenosis of right carotid artery: Secondary | ICD-10-CM | POA: Diagnosis not present

## 2022-01-28 ENCOUNTER — Other Ambulatory Visit (INDEPENDENT_AMBULATORY_CARE_PROVIDER_SITE_OTHER): Payer: Self-pay

## 2022-01-28 ENCOUNTER — Telehealth (INDEPENDENT_AMBULATORY_CARE_PROVIDER_SITE_OTHER): Payer: Self-pay

## 2022-01-28 MED ORDER — CLOPIDOGREL BISULFATE 75 MG PO TABS: 75.0000 mg | ORAL_TABLET | Freq: Every day | ORAL | 5 refills | Status: AC

## 2022-01-28 NOTE — Telephone Encounter (Signed)
Patient left a message requesting refill for Clopidogrel '75mg'$  to be sent to [facility]. Patient was made aware that refill has been sent to pharmacy

## 2022-01-30 ENCOUNTER — Ambulatory Visit: Admit: 2022-01-30 | Payer: Medicare Other | Admitting: Ophthalmology

## 2022-01-30 SURGERY — PHACOEMULSIFICATION, CATARACT, WITH IOL INSERTION
Anesthesia: Topical | Laterality: Right

## 2022-02-02 ENCOUNTER — Encounter (INDEPENDENT_AMBULATORY_CARE_PROVIDER_SITE_OTHER): Payer: Self-pay | Admitting: Nurse Practitioner

## 2022-02-02 NOTE — Progress Notes (Signed)
Subjective:    Patient ID: Betty Atkins, female    DOB: 1932/11/19, 88 y.o.   MRN: [id number] Chief Complaint  Patient presents with   Follow-up    Ultrasound follow up    The patient is seen for follow up evaluation of carotid stenosis status post right carotid stenting on 11/19/2021.  There were no post operative problems or complications related to the surgery.  The patient denies neck or incisional pain.  The patient denies interval amaurosis fugax. There is no recent history of TIA symptoms or focal motor deficits. There is no prior documented CVA.  The patient denies headache.  The patient is taking enteric-coated aspirin 81 mg daily.  No recent shortening of the patient's walking distance or new symptoms consistent with claudication.  No history of rest pain symptoms. No new ulcers or wounds of the lower extremities have occurred.  There is no history of DVT, PE or superficial thrombophlebitis. No recent episodes of angina or shortness of breath documented.    Deep noninvasive studies show a widely patent right carotid stent however a 40 to 59% stenosis of the ICA.  1 to 39% stenosis of the left ICA.  Bilateral vertebral arteries have antegrade flow.  There is disturbed flow in the right subclavian artery with normal flow hemodynamics in the left subclavian artery.  The patient denies any dizziness or upper extremity pain.    Review of Systems  Neurological:  Positive for weakness.  All other systems reviewed and are negative.      Objective:   Physical Exam Vitals reviewed.  HENT:     Head: Normocephalic.  Cardiovascular:     Rate and Rhythm: Normal rate.     Pulses: Normal pulses.  Pulmonary:     Effort: Pulmonary effort is normal.  Skin:    General: Skin is warm and dry.  Neurological:     Mental Status: She is alert and oriented to person, place, and time.  Psychiatric:        Mood and Affect: Mood normal.        Behavior: Behavior normal.         Thought Content: Thought content normal.        Judgment: Judgment normal.     BP (!) 143/85 (BP Location: Left Arm)   Pulse 94   Resp 16   Wt 223 lb (101.2 kg)   BMI 34.93 kg/m   Past Medical History:  Diagnosis Date   Anemia    Arthritis    degenerative   Dyspnea    Gout involving toe    Hyperlipidemia    Hypertension    Pre-diabetes     Social History   Socioeconomic History   Marital status: Widowed    Spouse name: Not on file   Number of children: Not on file   Years of education: Not on file   Highest education level: Not on file  Occupational History   Not on file  Tobacco Use   Smoking status: Never   Smokeless tobacco: Never  Vaping Use   Vaping Use: Never used  Substance and Sexual Activity   Alcohol use: No   Drug use: No   Sexual activity: Not Currently    Birth control/protection: Post-menopausal  Other Topics Concern   Not on file  Social History Narrative   Not on file   Social Determinants of Health   Financial Resource Strain: Not on file  Food Insecurity: Not on file  Transportation  Needs: Not on file  Physical Activity: Not on file  Stress: Not on file  Social Connections: Not on file  Intimate Partner Violence: Not on file    Past Surgical History:  Procedure Laterality Date   ABDOMINAL HYSTERECTOMY     CAROTID PTA/STENT INTERVENTION Right 11/19/2021   Procedure: CAROTID PTA/STENT INTERVENTION;  Surgeon: Algernon Huxley, MD;  Location: [facility];  Service: Cardiovascular;  Laterality: Right;   COLONOSCOPY WITH PROPOFOL N/A 05/02/2015   Procedure: COLONOSCOPY WITH PROPOFOL;  Surgeon: Lollie Sails, MD;  Location: [facility] END[facility]COPY;  Service: Endoscopy;  Laterality: N/A;   JOINT REPLACEMENT     right total hip    Family History  Problem Relation Age of Onset   Breast cancer Sister 67   Diabetes Sister     Allergies  Allergen Reactions   Iodinated Contrast Media Hives and Itching    I gave the patient 117m of  isovue 300. As i was getting her up after the scan she was itching the Left side of her face. She had developed two hives on the left side of her face. Dr. SClovis Rileycame and evaluated her. We gave her '50mg'$  of Benadryl and her symptoms improved. SPM I gave the patient 1057mof isovue 300. As i was getting her up after the scan she was itching the Left side of her face. She had developed two hives on the left side of her face. Dr. StClovis Rileyame and evaluated her. We gave her '50mg'$  of Benadryl and her symptoms improved. SPM I gave the patient 10037mf isovue 300. As i was getting her up after the scan she was itching the Left side of her face. She had developed two hives on the left side of her face. Dr. StrClovis Rileyme and evaluated her. We gave her '50mg'$  of Benadryl and her symptoms improved. SPM  I gave the patient 100m40m isovue 300. As i was getting her up after the scan she was itching the Left side of her face. She had developed two hives on the left side of her face. Dr. StroClovis Rileye and evaluated her. We gave her '50mg'$  of Benadryl and her symptoms improved. SPM I gave the patient 100ml41misovue 300. As i was getting her up after the scan she was itching the Left side of her face. She had developed two hives on the left side of her face. Dr. StrouClovis Riley and evaluated her. We gave her '50mg'$  of Benadryl and her symptoms improved. SPM I gave the patient 100ml 74msovue 300. As i was getting her up after the scan she was itching the Left side of her face. She had developed two hives on the left side of her face. Dr. StroudClovis Rileyand evaluated her. We gave her '50mg'$  of Benadryl and her symptoms improved. SPM    Metrizamide Hives and Itching    I gave the patient 100ml o70movue 300. As i was getting her up after the scan she was itching the Left side of her face. She had developed two hives on the left side of her face. Dr. Stroud Clovis Rileynd evaluated her. We gave her '50mg'$  of Benadryl and her symptoms improved. SPM        Latest Ref Rng & Units 11/20/2021    3:35 AM 10/24/2021    5:34 AM 10/23/2021    4:54 AM  CBC  WBC 4.0 - 10.5 K/uL 13.5  7.5  8.4   Hemoglobin 12.0 -  15.0 g/dL 12.0  13.7  13.3   Hematocrit 36.0 - 46.0 % 35.6  41.0  40.1   Platelets 150 - 400 K/uL 274  272  260       CMP     Component Value Date/Time   NA 130 (L) 11/20/2021 0335   NA 137 06/28/2012 0922   K 3.3 (L) 11/20/2021 0335   K 4.5 06/28/2012 0922   CL 99 11/20/2021 0335   CL 107 06/28/2012 0922   CO2 20 (L) 11/20/2021 0335   CO2 24 06/28/2012 0922   GLUCOSE 143 (H) 11/20/2021 0335   GLUCOSE 101 (H) 06/28/2012 0922   BUN 14 11/20/2021 0335   BUN 14 06/28/2012 0922   CREATININE 1.20 (H) 11/20/2021 0335   CREATININE 1.12 06/28/2012 0922   CALCIUM 8.4 (L) 11/20/2021 0335   CALCIUM 9.0 06/28/2012 0922   PROT 8.0 10/22/2021 1150   ALBUMIN 4.2 10/22/2021 1150   AST 26 10/22/2021 1150   ALT 25 10/22/2021 1150   ALKPHOS 62 10/22/2021 1150   BILITOT 0.5 10/22/2021 1150   GFRNONAA 44 (L) 11/20/2021 0335   GFRNONAA 47 (L) 06/28/2012 0922   GFRAA 55 (L) 06/28/2012 0922     No results found.     Assessment & Plan:   1. Carotid stenosis, symptomatic, with infarction [facility]) Recommend:  The patient is s/p successful right carotid stent placement  Duplex ultrasound preoperatively shows 1-39% contralateral stenosis.  Continue antiplatelet therapy as prescribed Continue management of CAD, HTN and Hyperlipidemia Healthy heart diet,  encouraged exercise at least 4 times per week  Follow up in 3 months with duplex ultrasound and physical exam based on the patient's carotid surgery   2. Mixed hyperlipidemia Continue statin as ordered and reviewed, no changes at this time   3. Type 2 diabetes mellitus without complication, without long-term current use of insulin (HCC) Continue hypoglycemic medications as already ordered, these medications have been reviewed and there are no changes at this time.  Hgb A1C to be  monitored as already arranged by primary service    Current Outpatient Medications on File Prior to Visit  Medication Sig Dispense Refill   allopurinol (ZYLOPRIM) 100 MG tablet Take 100 mg by mouth daily.     amLODipine-benazepril (LOTREL) 5-20 MG capsule Take 1 capsule by mouth daily.     aspirin EC 81 MG EC tablet Take 1 tablet (81 mg total) by mouth daily. Swallow whole. 30 tablet 11   atorvastatin (LIPITOR) 80 MG tablet Take 1 tablet (80 mg total) by mouth daily. 30 tablet 0   Calcium Carbonate-Vit D-Min (CALTRATE PLUS PO) Take 1 tablet by mouth daily.     cetirizine (ZYRTEC) 10 MG tablet Take by mouth as needed.     Ciclopirox 1 % shampoo APP ON THE SKIN UTD     clobetasol ointment (TEMOVATE) 0.05 % Apply to affected area every night for 4 weeks, then every other day for 4 weeks and then twice a week for 4 weeks or until resolution. 30 g 5   docusate sodium (COLACE) 100 MG capsule Take 1 capsule (100 mg total) by mouth 2 (two) times daily as needed for mild constipation. 10 capsule 0   hydrochlorothiazide (HYDRODIURIL) 25 MG tablet Take 25 mg by mouth daily.     Multiple Vitamin (MULTIVITAMIN) tablet Take 1 tablet by mouth daily.     oxyCODONE-acetaminophen (PERCOCET/ROXICET) 5-325 MG tablet Take 1 tablet by mouth every 6 (six) hours as needed for moderate pain. [place]  tablet 0   timolol (TIMOPTIC) 0.5 % ophthalmic solution Place 1 drop into both eyes 2 (two) times daily.     Vitamin D3 (VITAMIN D) 25 MCG tablet Take 1,000 Units by mouth daily.     No current facility-administered medications on file prior to visit.    There are no Patient Instructions on file for this visit. No follow-ups on file.   Kris Hartmann, NP

## 2022-03-11 ENCOUNTER — Other Ambulatory Visit: Payer: Self-pay

## 2022-03-11 ENCOUNTER — Emergency Department: Payer: Medicare Other

## 2022-03-11 ENCOUNTER — Emergency Department
Admission: EM | Admit: 2022-03-11 | Discharge: 2022-03-11 | Disposition: A | Discharge status: Home / Self Care | Payer: Medicare Other | Attending: Student in an Organized Health Care Education/Training Program | Admitting: Student in an Organized Health Care Education/Training Program

## 2022-03-11 DIAGNOSIS — Z8673 Personal history of transient ischemic attack (TIA), and cerebral infarction without residual deficits: Secondary | ICD-10-CM | POA: Insufficient documentation

## 2022-03-11 DIAGNOSIS — Z5321 Procedure and treatment not carried out due to patient leaving prior to being seen by health care provider: Secondary | ICD-10-CM | POA: Insufficient documentation

## 2022-03-11 DIAGNOSIS — R202 Paresthesia of skin: Secondary | ICD-10-CM | POA: Diagnosis present

## 2022-03-11 DIAGNOSIS — I6782 Cerebral ischemia: Secondary | ICD-10-CM | POA: Insufficient documentation

## 2022-03-11 LAB — CBC
HCT: 40.5 % (ref 36.0–46.0)
Hemoglobin: 13.3 g/dL (ref 12.0–15.0)
MCH: 29.6 pg (ref 26.0–34.0)
MCHC: 32.8 g/dL (ref 30.0–36.0)
MCV: 90.2 fL (ref 80.0–100.0)
Platelets: 279 10*3/uL (ref 150–400)
RBC: 4.49 MIL/uL (ref 3.87–5.11)
RDW: 12.5 % (ref 11.5–15.5)
WBC: 7.8 10*3/uL (ref 4.0–10.5)
nRBC: 0 % (ref 0.0–0.2)

## 2022-03-11 LAB — COMPREHENSIVE METABOLIC PANEL
ALT: 18 U/L (ref 0–44)
AST: 22 U/L (ref 15–41)
Albumin: 4.3 g/dL (ref 3.5–5.0)
Alkaline Phosphatase: 58 U/L (ref 38–126)
Anion gap: 10 (ref 5–15)
BUN: 19 mg/dL (ref 8–23)
CO2: 21 mmol/L — ABNORMAL LOW (ref 22–32)
Calcium: 9.8 mg/dL (ref 8.9–10.3)
Chloride: 106 mmol/L (ref 98–111)
Creatinine, Ser: 1.05 mg/dL — ABNORMAL HIGH (ref 0.44–1.00)
GFR, Estimated: 52 mL/min — ABNORMAL LOW (ref >60)
Glucose, Bld: 107 mg/dL — ABNORMAL HIGH (ref 70–99)
Potassium: 3.5 mmol/L (ref 3.5–5.1)
Sodium: 137 mmol/L (ref 135–145)
Total Bilirubin: 0.9 mg/dL (ref 0.3–1.2)
Total Protein: 7.8 g/dL (ref 6.5–8.1)

## 2022-03-11 LAB — DIFFERENTIAL
Abs Immature Granulocytes: 0.01 10*3/uL (ref 0.00–0.07)
Basophils Absolute: 0 10*3/uL (ref 0.0–0.1)
Basophils Relative: 0 %
Eosinophils Absolute: 0.2 10*3/uL (ref 0.0–0.5)
Eosinophils Relative: 3 %
Immature Granulocytes: 0 %
Lymphocytes Relative: 40 %
Lymphs Abs: 3.1 10*3/uL (ref 0.7–4.0)
Monocytes Absolute: 0.6 10*3/uL (ref 0.1–1.0)
Monocytes Relative: 8 %
Neutro Abs: 3.8 10*3/uL (ref 1.7–7.7)
Neutrophils Relative %: 49 %

## 2022-03-11 LAB — PROTIME-INR
INR: 1.1 (ref 0.8–1.2)
Prothrombin Time: 13.7 seconds (ref 11.4–15.2)

## 2022-03-11 LAB — APTT: aPTT: 30 seconds (ref 24–36)

## 2022-03-11 NOTE — ED Notes (Signed)
Call x3 to room patient. No answer from lobby. Patient not visualized by RN.

## 2022-03-11 NOTE — ED Triage Notes (Signed)
Pt to ED via POV from home. Pt reports yesterday she started having a funny feeling/tingling in the back of her neck bilaterally. Pt reports she got up this morning she had that same feeling and intermittent tingling in the top of her head. Pt denies N/V, visual changes, speech changes or HA.   Pt reports on April 3rd she had a stroke from a 99% blockage in her neck. Stent was placed on May 1st. Pt on blood thinner.   PA in triage doing MSE.

## 2022-03-11 NOTE — ED Provider Triage Note (Signed)
Emergency Medicine Provider Triage Evaluation Note  Betty Atkins , a 88 y.o. female  was evaluated in triage.  Pt complains of "tingling of posterior neck that started yesterday. No history of injury.    Review of Systems  Positive: + hx of carotid stint and  + CVA x 2.   Negative: Neg vision, n, v, vision changes.  No changes in speech.   Physical Exam  There were no vitals taken for this visit. Gen:   Awake, no distress   Resp:  Normal effort  Lungs clear. MSK:   Moves extremities without difficulty  Other:    Medical Decision Making  Medically screening exam initiated at 1:51 PM.  Appropriate orders placed.  Melrose Nakayama was informed that the remainder of the evaluation will be completed by another provider, this initial triage assessment does not replace that evaluation, and the importance of remaining in the ED until their evaluation is complete.     Johnn Hai, PA-C 03/11/22 1359

## 2022-04-17 ENCOUNTER — Other Ambulatory Visit (INDEPENDENT_AMBULATORY_CARE_PROVIDER_SITE_OTHER): Payer: Self-pay | Admitting: Vascular Surgery

## 2022-04-17 DIAGNOSIS — I6523 Occlusion and stenosis of bilateral carotid arteries: Secondary | ICD-10-CM

## 2022-04-19 ENCOUNTER — Ambulatory Visit (INDEPENDENT_AMBULATORY_CARE_PROVIDER_SITE_OTHER): Payer: Medicare Other

## 2022-04-19 ENCOUNTER — Encounter (INDEPENDENT_AMBULATORY_CARE_PROVIDER_SITE_OTHER): Payer: Self-pay | Admitting: Vascular Surgery

## 2022-04-19 ENCOUNTER — Ambulatory Visit (INDEPENDENT_AMBULATORY_CARE_PROVIDER_SITE_OTHER): Payer: Medicare Other | Admitting: Vascular Surgery

## 2022-04-19 VITALS — BP 134/81 | HR 83 | Resp 16 | Wt 225.0 lb

## 2022-04-19 DIAGNOSIS — I6523 Occlusion and stenosis of bilateral carotid arteries: Secondary | ICD-10-CM | POA: Diagnosis not present

## 2022-04-19 DIAGNOSIS — E119 Type 2 diabetes mellitus without complications: Secondary | ICD-10-CM

## 2022-04-19 DIAGNOSIS — I10 Essential (primary) hypertension: Secondary | ICD-10-CM | POA: Diagnosis not present

## 2022-04-19 DIAGNOSIS — E785 Hyperlipidemia, unspecified: Secondary | ICD-10-CM

## 2022-04-19 DIAGNOSIS — I63239 Cerebral infarction due to unspecified occlusion or stenosis of unspecified carotid arteries: Secondary | ICD-10-CM | POA: Diagnosis not present

## 2022-04-19 NOTE — Assessment & Plan Note (Signed)
blood pressure control important in reducing the progression of atherosclerotic disease. On appropriate oral medications.  

## 2022-04-19 NOTE — Assessment & Plan Note (Signed)
blood glucose control important in reducing the progression of atherosclerotic disease. Also, involved in wound healing. On appropriate medications.  

## 2022-04-19 NOTE — Assessment & Plan Note (Signed)
lipid control important in reducing the progression of atherosclerotic disease. Continue statin therapy  

## 2022-04-19 NOTE — Assessment & Plan Note (Signed)
Her duplex shows velocities that would technically fall in the 40 to 59% range on the right side but with a carotid stent would be within the realm of normal.  Her left carotid velocities are in the 1 to 39% range. She is overall doing well.  Continue aspirin, statin, and Plavix.  Recheck in 6 months.

## 2022-04-19 NOTE — Progress Notes (Signed)
MRN : [id number]  Betty Atkins is a 88 y.o. (1933-01-10) female who presents with chief complaint of  Chief Complaint  Patient presents with   Follow-up    Ultrasound follow up  .  History of Present Illness: Patient returns approximately 5 months status post right carotid stent placement for high-grade stenosis with previous stroke.  She has done great.  She has had no periprocedural complications and no neurologic symptoms since her procedure.  She denies any new problems today.  Her duplex shows velocities that would technically fall in the 40 to 59% range on the right side but with a carotid stent would be within the realm of normal.  Her left carotid velocities are in the 1 to 39% range.  Current Outpatient Medications  Medication Sig Dispense Refill   allopurinol (ZYLOPRIM) 100 MG tablet Take 100 mg by mouth daily.     amLODipine-benazepril (LOTREL) 5-20 MG capsule Take 1 capsule by mouth daily.     aspirin EC 81 MG EC tablet Take 1 tablet (81 mg total) by mouth daily. Swallow whole. 30 tablet 11   atorvastatin (LIPITOR) 80 MG tablet Take 1 tablet (80 mg total) by mouth daily. 30 tablet 0   Calcium Carbonate-Vit D-Min (CALTRATE PLUS PO) Take 1 tablet by mouth daily.     cetirizine (ZYRTEC) 10 MG tablet Take by mouth as needed.     Ciclopirox 1 % shampoo APP ON THE SKIN UTD     clobetasol ointment (TEMOVATE) 0.05 % Apply to affected area every night for 4 weeks, then every other day for 4 weeks and then twice a week for 4 weeks or until resolution. 30 g 5   clopidogrel (PLAVIX) 75 MG tablet Take 1 tablet (75 mg total) by mouth daily. 30 tablet 5   docusate sodium (COLACE) 100 MG capsule Take 1 capsule (100 mg total) by mouth 2 (two) times daily as needed for mild constipation. 10 capsule 0   hydrochlorothiazide (HYDRODIURIL) 25 MG tablet Take 25 mg by mouth daily.     Multiple Vitamin (MULTIVITAMIN) tablet Take 1 tablet by mouth daily.     timolol (TIMOPTIC) 0.5 % ophthalmic  solution Place 1 drop into both eyes 2 (two) times daily.     Vitamin D3 (VITAMIN D) 25 MCG tablet Take 1,000 Units by mouth daily.     oxyCODONE-acetaminophen (PERCOCET/ROXICET) 5-325 MG tablet Take 1 tablet by mouth every 6 (six) hours as needed for moderate pain. (Patient not taking: Reported on 04/19/2022) 30 tablet 0   No current facility-administered medications for this visit.    Past Medical History:  Diagnosis Date   Anemia    Arthritis    degenerative   Dyspnea    Gout involving toe    Hyperlipidemia    Hypertension    Pre-diabetes     Past Surgical History:  Procedure Laterality Date   ABDOMINAL HYSTERECTOMY     CAROTID PTA/STENT INTERVENTION Right 11/19/2021   Procedure: CAROTID PTA/STENT INTERVENTION;  Surgeon: Algernon Huxley, MD;  Location: [facility];  Service: Cardiovascular;  Laterality: Right;   COLONOSCOPY WITH PROPOFOL N/A 05/02/2015   Procedure: COLONOSCOPY WITH PROPOFOL;  Surgeon: Lollie Sails, MD;  Location: [facility] ENDOSCOPY;  Service: Endoscopy;  Laterality: N/A;   JOINT REPLACEMENT     right total hip     Social History   Tobacco Use   Smoking status: Never   Smokeless tobacco: Never  Vaping Use   Vaping Use: Never  used  Substance Use Topics   Alcohol use: No   Drug use: No       Family History  Problem Relation Age of Onset   Breast cancer Sister 43   Diabetes Sister      Allergies  Allergen Reactions   Iodinated Contrast Media Hives and Itching    I gave the patient 134m of isovue 300. As i was getting her up after the scan she was itching the Left side of her face. She had developed two hives on the left side of her face. Dr. SClovis Rileycame and evaluated her. We gave her '50mg'$  of Benadryl and her symptoms improved. SPM I gave the patient 1030mof isovue 300. As i was getting her up after the scan she was itching the Left side of her face. She had developed two hives on the left side of her face. Dr. StClovis Rileyame and evaluated  her. We gave her '50mg'$  of Benadryl and her symptoms improved. SPM I gave the patient 1007mf isovue 300. As i was getting her up after the scan she was itching the Left side of her face. She had developed two hives on the left side of her face. Dr. StrClovis Rileyme and evaluated her. We gave her '50mg'$  of Benadryl and her symptoms improved. SPM  I gave the patient 100m45m isovue 300. As i was getting her up after the scan she was itching the Left side of her face. She had developed two hives on the left side of her face. Dr. StroClovis Rileye and evaluated her. We gave her '50mg'$  of Benadryl and her symptoms improved. SPM I gave the patient 100ml75misovue 300. As i was getting her up after the scan she was itching the Left side of her face. She had developed two hives on the left side of her face. Dr. StrouClovis Riley and evaluated her. We gave her '50mg'$  of Benadryl and her symptoms improved. SPM I gave the patient 100ml 69msovue 300. As i was getting her up after the scan she was itching the Left side of her face. She had developed two hives on the left side of her face. Dr. StroudClovis Rileyand evaluated her. We gave her '50mg'$  of Benadryl and her symptoms improved. SPM    Metrizamide Hives and Itching    I gave the patient 100ml o67movue 300. As i was getting her up after the scan she was itching the Left side of her face. She had developed two hives on the left side of her face. Dr. Stroud Clovis Rileynd evaluated her. We gave her '50mg'$  of Benadryl and her symptoms improved. SPM     REVIEW OF SYSTEMS (Negative unless checked)  Constitutional: '[]'$ Weight loss  '[]'$ Fever  '[]'$ Chills Cardiac: '[]'$ Chest pain   '[]'$ Chest pressure   '[]'$ Palpitations   '[]'$ Shortness of breath when laying flat   '[]'$ Shortness of breath at rest   '[x]'$ Shortness of breath with exertion. Vascular:  '[]'$ Pain in legs with walking   '[]'$ Pain in legs at rest   '[]'$ Pain in legs when laying flat   '[]'$ Claudication   '[]'$ Pain in feet when walking  '[]'$ Pain in feet at rest  '[]'$ Pain in feet  when laying flat   '[]'$ History of DVT   '[]'$ Phlebitis   '[]'$ Swelling in legs   '[]'$ Varicose veins   '[]'$ Non-healing ulcers Pulmonary:   '[]'$ Uses home oxygen   '[]'$ Productive cough   '[]'$ Hemoptysis   '[]'$ Wheeze  '[]'$ COPD   '[]'$ Asthma Neurologic:  '[]'$ Dizziness  '[]'$ Blackouts   '[]'$   Seizures   '[x]'$ History of stroke   '[]'$ History of TIA  '[]'$ Aphasia   '[]'$ Temporary blindness   '[]'$ Dysphagia   '[]'$ Weakness or numbness in arms   '[]'$ Weakness or numbness in legs Musculoskeletal:  '[x]'$ Arthritis   '[]'$ Joint swelling   '[]'$ Joint pain   '[]'$ Low back pain Hematologic:  '[]'$ Easy bruising  '[]'$ Easy bleeding   '[]'$ Hypercoagulable state   '[]'$ Anemic  '[]'$ Hepatitis Gastrointestinal:  '[]'$ Blood in stool   '[]'$ Vomiting blood  '[]'$ Gastroesophageal reflux/heartburn   '[]'$ Difficulty swallowing. Genitourinary:  '[]'$ Chronic kidney disease   '[]'$ Difficult urination  '[]'$ Frequent urination  '[]'$ Burning with urination   '[]'$ Blood in urine Skin:  '[]'$ Rashes   '[]'$ Ulcers   '[]'$ Wounds Psychological:  '[]'$ History of anxiety   '[]'$  History of major depression.  Physical Examination  Vitals:   04/19/22 0935  BP: 134/81  Pulse: 83  Resp: 16  Weight: 225 lb (102.1 kg)   Body mass index is 34.21 kg/m. Gen:  WD/WN, NAD. Appears far younger than stated age. Head: [place]/AT, No temporalis wasting. Ear/Nose/Throat: Hearing grossly intact, nares w/o erythema or drainage, trachea midline Eyes: Conjunctiva clear. Sclera non-icteric Neck: Supple.  No bruit  Pulmonary:  Good air movement, equal and clear to auscultation bilaterally.  Cardiac: RRR, No JVD Vascular:  Vessel Right Left  Radial Palpable Palpable           Musculoskeletal: M/S 5/5 throughout.  No deformity or atrophy. Neurologic: CN 2-12 intact. Sensation grossly intact in extremities.  Symmetrical.  Speech is fluent. Motor exam as listed above. Psychiatric: Judgment intact, Mood & affect appropriate for pt's clinical situation. Dermatologic: No rashes or ulcers noted.  No cellulitis or open wounds.     CBC Lab Results  Component Value Date    WBC 7.8 03/11/2022   HGB 13.3 03/11/2022   HCT 40.5 03/11/2022   MCV 90.2 03/11/2022   PLT 279 03/11/2022    BMET    Component Value Date/Time   NA 137 03/11/2022 1358   NA 137 06/28/2012 0922   K 3.5 03/11/2022 1358   K 4.5 06/28/2012 0922   CL 106 03/11/2022 1358   CL 107 06/28/2012 0922   CO2 21 (L) 03/11/2022 1358   CO2 24 06/28/2012 0922   GLUCOSE 107 (H) 03/11/2022 1358   GLUCOSE 101 (H) 06/28/2012 0922   BUN 19 03/11/2022 1358   BUN 14 06/28/2012 0922   CREATININE 1.05 (H) 03/11/2022 1358   CREATININE 1.12 06/28/2012 0922   CALCIUM 9.8 03/11/2022 1358   CALCIUM 9.0 06/28/2012 0922   GFRNONAA 52 (L) 03/11/2022 1358   GFRNONAA 47 (L) 06/28/2012 0922   GFRAA 55 (L) 06/28/2012 0922   CrCl cannot be calculated (Patient's most recent lab result is older than the maximum 21 days allowed.).  COAG Lab Results  Component Value Date   INR 1.1 03/11/2022   INR 1.0 10/22/2021    Radiology No results found.   Assessment/Plan Carotid stenosis, symptomatic, with infarction (HCC) Her duplex shows velocities that would technically fall in the 40 to 59% range on the right side but with a carotid stent would be within the realm of normal.  Her left carotid velocities are in the 1 to 39% range. She is overall doing well.  Continue aspirin, statin, and Plavix.  Recheck in 6 months.  Essential hypertension blood pressure control important in reducing the progression of atherosclerotic disease. On appropriate oral medications.   Type 2 diabetes mellitus without complication, without long-term current use of insulin (HCC) blood glucose control important in reducing the progression  of atherosclerotic disease. Also, involved in wound healing. On appropriate medications.   Hyperlipidemia lipid control important in reducing the progression of atherosclerotic disease. Continue statin therapy    Leotis Pain, MD  04/19/2022 10:09 AM    This note was created with Dragon medical  transcription system.  Any errors from dictation are purely unintentional

## 2022-05-11 IMAGING — CT CT HEAD W/O CM
4 series · 17 of 47 positions shown, 19 images · non-contrast
Comparison: Brain MRI from yesterday

CLINICAL DATA: Stroke follow-up



[Series 2: head bone · axial · 0.43mm/px · z∈[-68,-12]mm · 4 of 79 slices shown]
[im 8/79  bone]
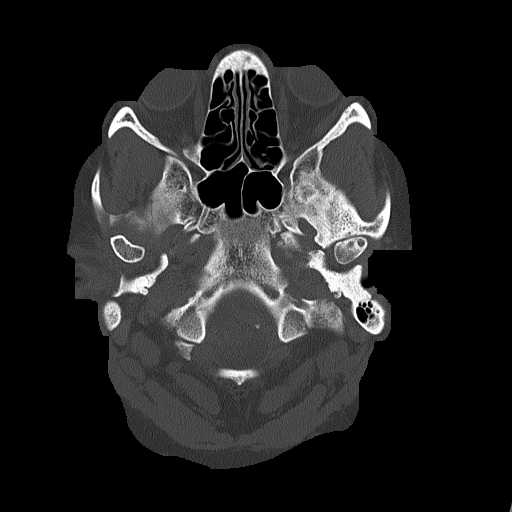
[im 16/79  bone]
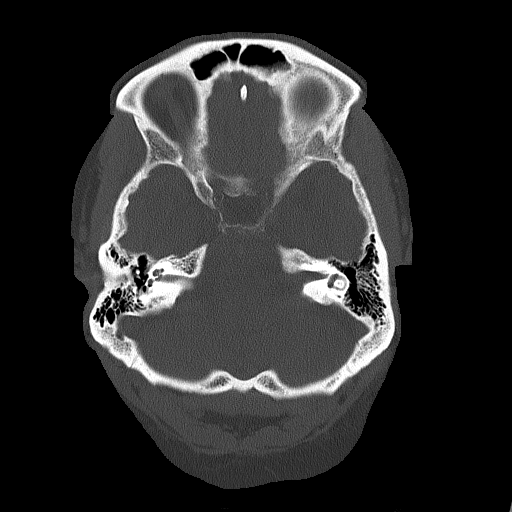
[im 24/79  bone]
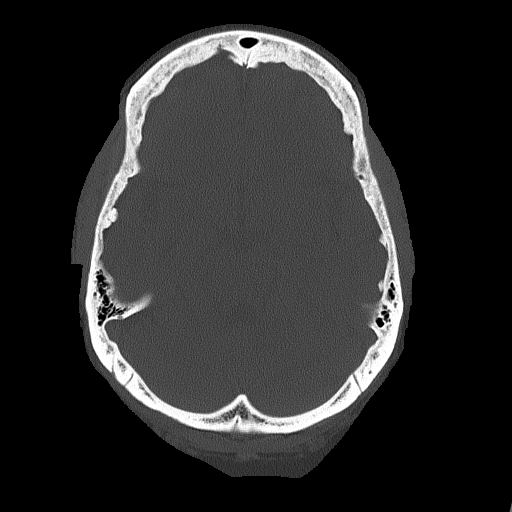
[im 36/79  bone]
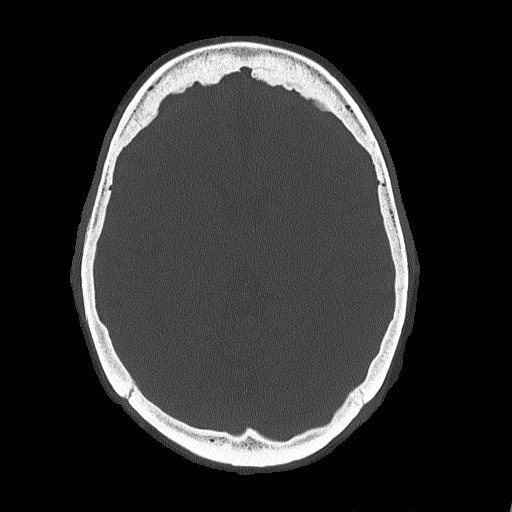

[Series 3: head wo · axial · 0.43mm/px · z∈[-67,+53]mm · 7 of 32 slices shown, 9 images]
[im 4/32  brain]
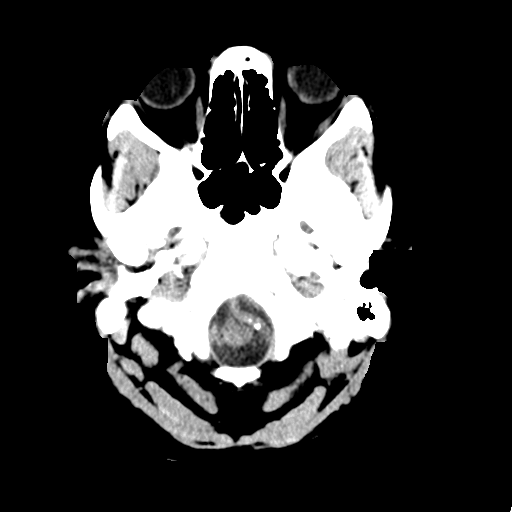
[im 4/32  bone]
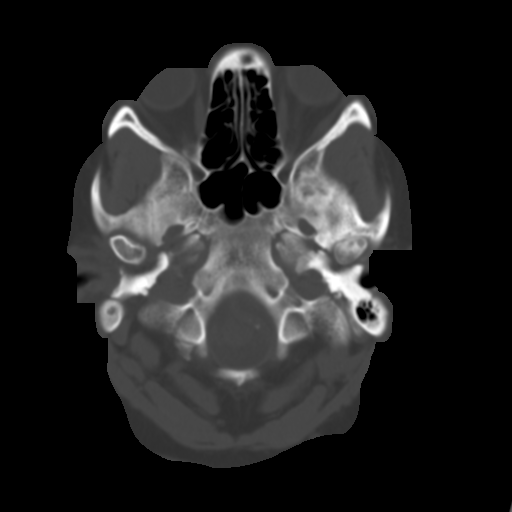
[im 8/32  brain]
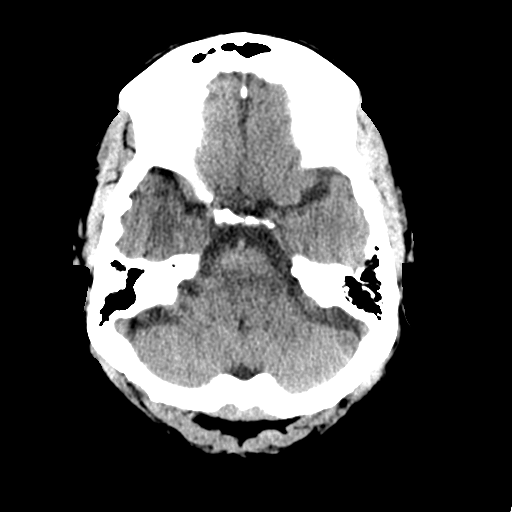
[im 12/32  brain]
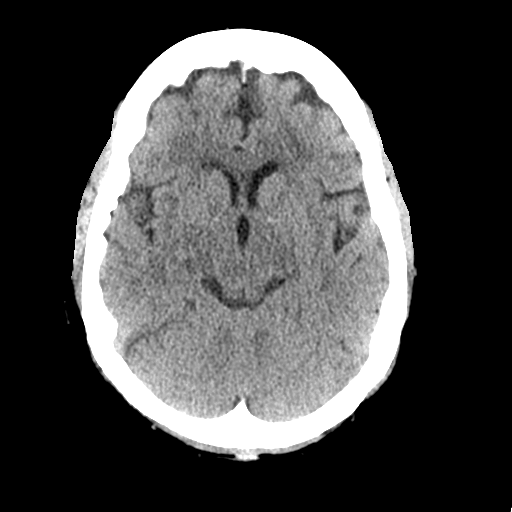
[im 16/32  brain]
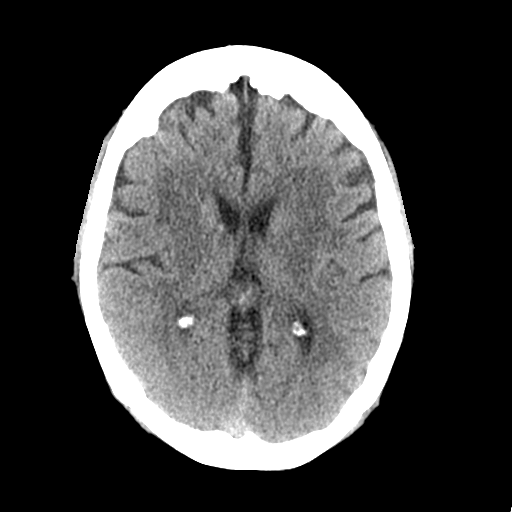
[im 20/32  brain]
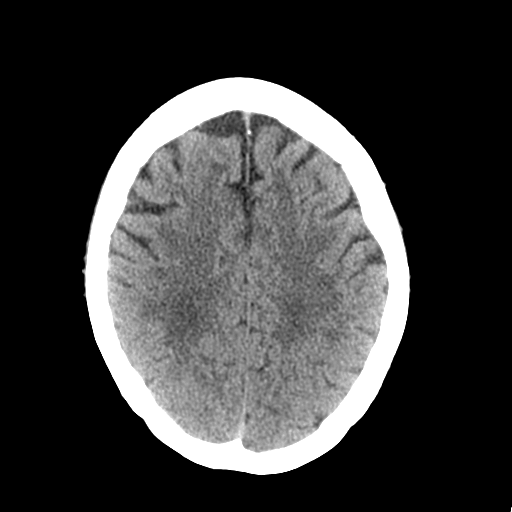
[im 20/32  bone]
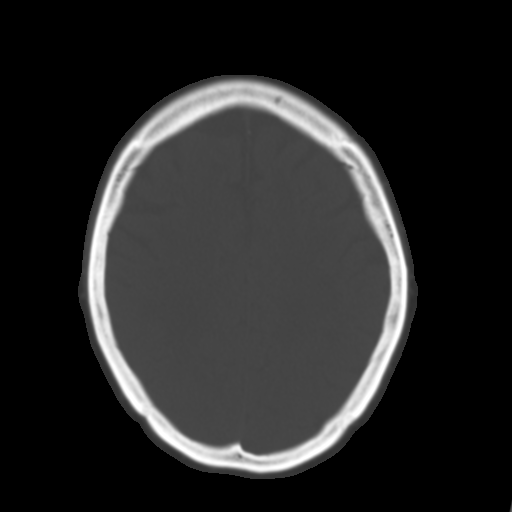
[im 24/32  brain]
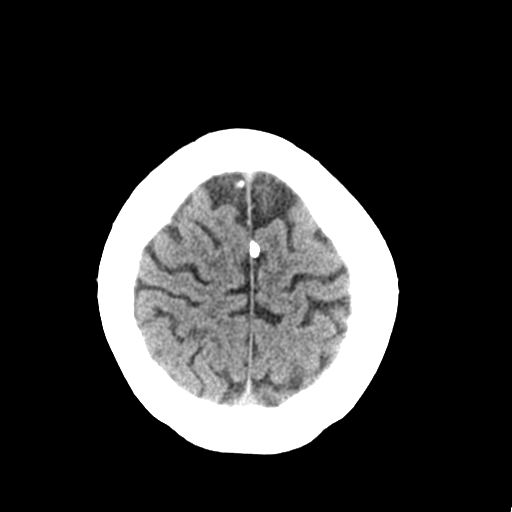
[im 28/32  brain]
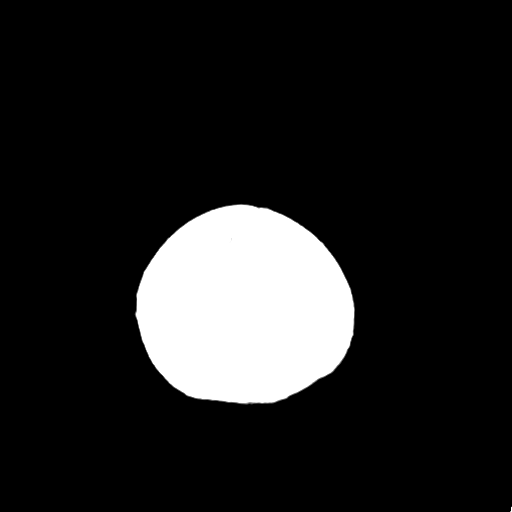

[Series 4: coronal soft tissue · coronal · 0.33mm/px · 3 of 67 slices shown]
[im 23/67  brain]
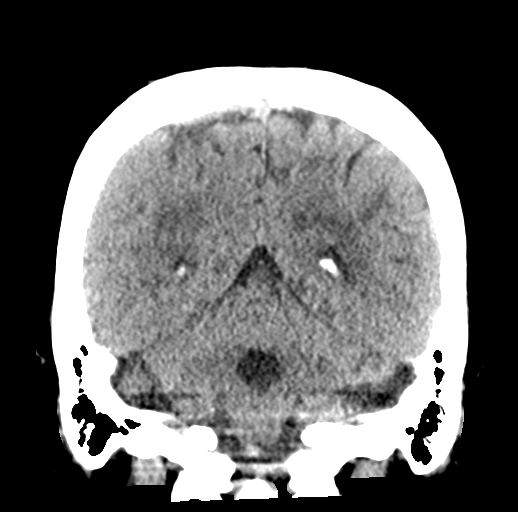
[im 30/67  brain]
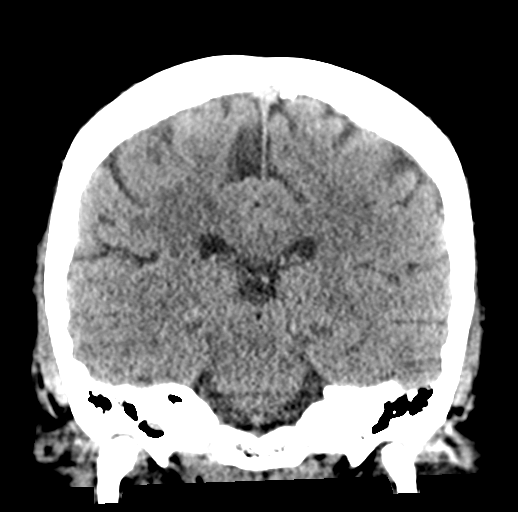
[im 37/67  brain]
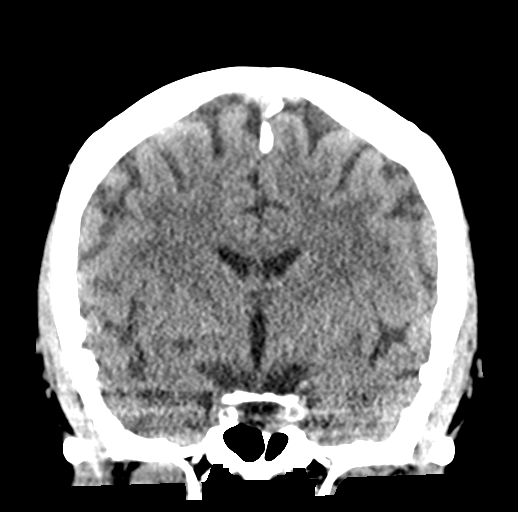

[Series 5: sagittal soft tissue · sagittal · 0.33mm/px · 3 of 57 slices shown]
[im 19/57  brain]
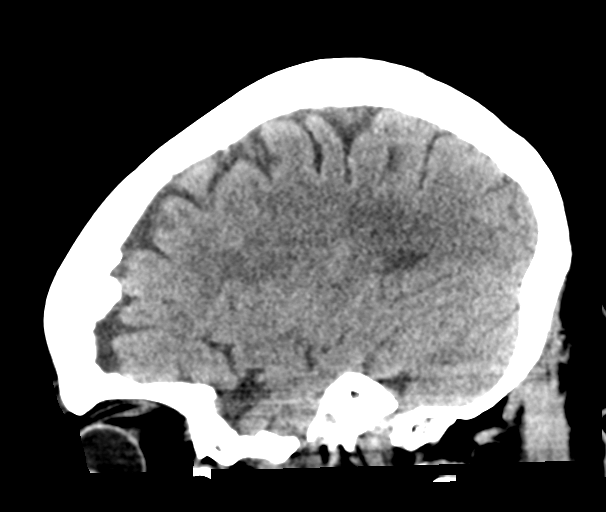
[im 29/57  brain]
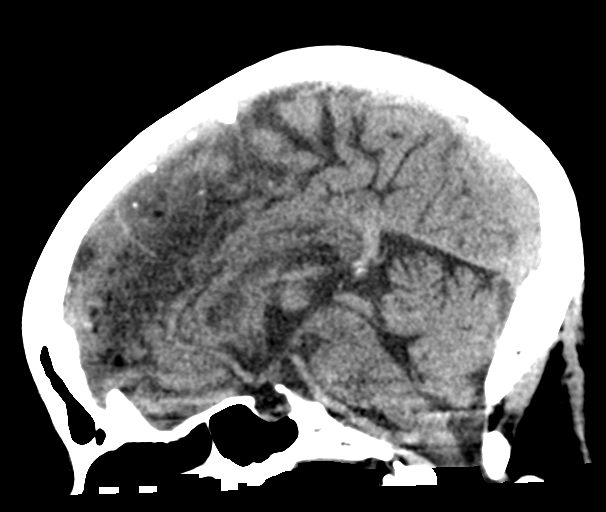
[im 38/57  brain]
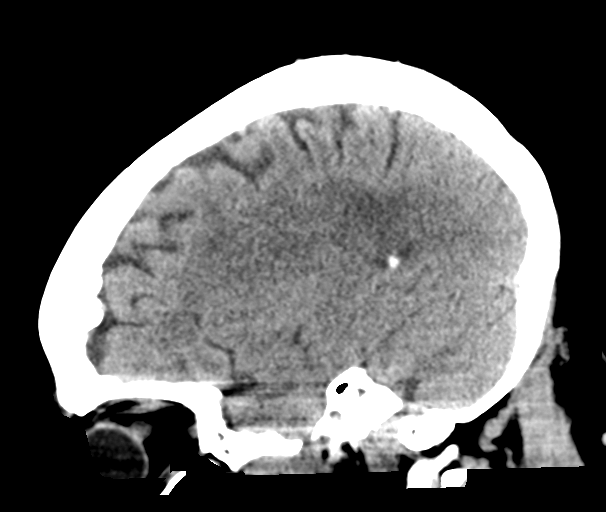

[17 of 47 positions shown; findings below may reference images not displayed]

FINDINGS: Brain: Acute to subacute infarcts by MRI are largely occult by CT.
No hemorrhage, hydrocephalus, or masslike finding. No evidence of
interval gray matter infarction. Chronic small vessel ischemia in
the hemispheric white matter. Normal brain volume for age

Vascular: No hyperdense vessel or unexpected calcification.

Skull: Normal. Negative for fracture or focal lesion.

Sinuses/Orbits: No acute finding.
IMPRESSION: Known acute infarcts by MRI.  No hemorrhage or visible progression.

## 2022-06-12 ENCOUNTER — Other Ambulatory Visit: Payer: Self-pay | Admitting: Infectious Diseases

## 2022-06-12 DIAGNOSIS — Z1231 Encounter for screening mammogram for malignant neoplasm of breast: Secondary | ICD-10-CM

## 2022-10-18 ENCOUNTER — Encounter (INDEPENDENT_AMBULATORY_CARE_PROVIDER_SITE_OTHER): Payer: Medicare Other

## 2022-10-18 ENCOUNTER — Ambulatory Visit (INDEPENDENT_AMBULATORY_CARE_PROVIDER_SITE_OTHER): Payer: PRIVATE HEALTH INSURANCE | Admitting: Vascular Surgery

## 2022-10-23 ENCOUNTER — Other Ambulatory Visit (INDEPENDENT_AMBULATORY_CARE_PROVIDER_SITE_OTHER): Payer: Self-pay | Admitting: Vascular Surgery

## 2022-10-23 DIAGNOSIS — I6523 Occlusion and stenosis of bilateral carotid arteries: Secondary | ICD-10-CM

## 2022-11-05 ENCOUNTER — Encounter (INDEPENDENT_AMBULATORY_CARE_PROVIDER_SITE_OTHER): Payer: Self-pay | Admitting: Vascular Surgery

## 2022-11-05 ENCOUNTER — Ambulatory Visit (INDEPENDENT_AMBULATORY_CARE_PROVIDER_SITE_OTHER): Payer: Medicare Other | Admitting: Vascular Surgery

## 2022-11-05 ENCOUNTER — Ambulatory Visit (INDEPENDENT_AMBULATORY_CARE_PROVIDER_SITE_OTHER): Payer: Medicare Other

## 2022-11-05 VITALS — BP 138/73 | HR 82 | Resp 16 | Wt 227.4 lb

## 2022-11-05 DIAGNOSIS — I10 Essential (primary) hypertension: Secondary | ICD-10-CM | POA: Diagnosis not present

## 2022-11-05 DIAGNOSIS — E119 Type 2 diabetes mellitus without complications: Secondary | ICD-10-CM | POA: Diagnosis not present

## 2022-11-05 DIAGNOSIS — E785 Hyperlipidemia, unspecified: Secondary | ICD-10-CM

## 2022-11-05 DIAGNOSIS — I63239 Cerebral infarction due to unspecified occlusion or stenosis of unspecified carotid arteries: Secondary | ICD-10-CM | POA: Diagnosis not present

## 2022-11-05 DIAGNOSIS — I6523 Occlusion and stenosis of bilateral carotid arteries: Secondary | ICD-10-CM

## 2022-11-05 NOTE — Progress Notes (Signed)
MRN : [id number]  Betty Atkins is a 89 y.o. (03-Dec-1932) female who presents with chief complaint of  Chief Complaint  Patient presents with   Follow-up    Ultrasound follow up  .  History of Present Illness: Patient returns in follow-up of her carotid disease.  She is doing well today.  She denies any focal neurologic symptoms.  She has undergone right carotid stent placement almost a year ago now.  Carotid duplex today shows right carotid velocities which would fall in the 40 to 59% range but are likely within the realm of normal with a carotid stent.  Left carotid artery velocities remain in the 1 to 39% range.  Current Outpatient Medications  Medication Sig Dispense Refill   allopurinol (ZYLOPRIM) 100 MG tablet Take 100 mg by mouth daily.     amLODipine-benazepril (LOTREL) 5-20 MG capsule Take 1 capsule by mouth daily.     aspirin EC 81 MG EC tablet Take 1 tablet (81 mg total) by mouth daily. Swallow whole. 30 tablet 11   atorvastatin (LIPITOR) 80 MG tablet Take 1 tablet (80 mg total) by mouth daily. 30 tablet 0   Calcium Carbonate-Vit D-Min (CALTRATE PLUS PO) Take 1 tablet by mouth daily.     cetirizine (ZYRTEC) 10 MG tablet Take by mouth as needed.     Ciclopirox 1 % shampoo APP ON THE SKIN UTD     clobetasol ointment (TEMOVATE) 0.05 % Apply to affected area every night for 4 weeks, then every other day for 4 weeks and then twice a week for 4 weeks or until resolution. 30 g 5   clopidogrel (PLAVIX) 75 MG tablet Take 1 tablet (75 mg total) by mouth daily. 30 tablet 5   docusate sodium (COLACE) 100 MG capsule Take 1 capsule (100 mg total) by mouth 2 (two) times daily as needed for mild constipation. 10 capsule 0   hydrochlorothiazide (HYDRODIURIL) 25 MG tablet Take 25 mg by mouth daily.     Multiple Vitamin (MULTIVITAMIN) tablet Take 1 tablet by mouth daily.     timolol (TIMOPTIC) 0.5 % ophthalmic solution Place 1 drop into both eyes 2 (two) times daily.     Vitamin D3 (VITAMIN  D) 25 MCG tablet Take 1,000 Units by mouth daily.     oxyCODONE-acetaminophen (PERCOCET/ROXICET) 5-325 MG tablet Take 1 tablet by mouth every 6 (six) hours as needed for moderate pain. (Patient not taking: Reported on 04/19/2022) 30 tablet 0   No current facility-administered medications for this visit.    Past Medical History:  Diagnosis Date   Anemia    Arthritis    degenerative   Dyspnea    Gout involving toe    Hyperlipidemia    Hypertension    Pre-diabetes     Past Surgical History:  Procedure Laterality Date   ABDOMINAL HYSTERECTOMY     CAROTID PTA/STENT INTERVENTION Right 11/19/2021   Procedure: CAROTID PTA/STENT INTERVENTION;  Surgeon: Annice Needy, MD;  Location: [facility] INVASIVE CV LAB;  Service: Cardiovascular;  Laterality: Right;   COLONOSCOPY WITH PROPOFOL N/A 05/02/2015   Procedure: COLONOSCOPY WITH PROPOFOL;  Surgeon: Christena Deem, MD;  Location: [facility] ENDOSCOPY;  Service: Endoscopy;  Laterality: N/A;   JOINT REPLACEMENT     right total hip     Social History   Tobacco Use   Smoking status: Never   Smokeless tobacco: Never  Vaping Use   Vaping Use: Never used  Substance Use Topics   Alcohol use: No  Drug use: No      Family History  Problem Relation Age of Onset   Breast cancer Sister 61   Diabetes Sister      Allergies  Allergen Reactions   Iodinated Contrast Media Hives and Itching    I gave the patient of isovue 300. As i was getting her up after the scan she was itching the Left side of her face. She had developed two hives on the left side of her face. Dr. Bradly Chris came and evaluated her. We gave her 50mg  of Benadryl and her symptoms improved. SPM I gave the patient of isovue 300. As i was getting her up after the scan she was itching the Left side of her face. She had developed two hives on the left side of her face. Dr. Bradly Chris came and evaluated her. We gave her 50mg  of Benadryl and her symptoms improved. SPM I gave the patient  of isovue 300. As i was getting her up after the scan she was itching the Left side of her face. She had developed two hives on the left side of her face. Dr. Bradly Chris came and evaluated her. We gave her 50mg  of Benadryl and her symptoms improved. SPM  I gave the patient of isovue 300. As i was getting her up after the scan she was itching the Left side of her face. She had developed two hives on the left side of her face. Dr. Bradly Chris came and evaluated her. We gave her 50mg  of Benadryl and her symptoms improved. SPM I gave the patient of isovue 300. As i was getting her up after the scan she was itching the Left side of her face. She had developed two hives on the left side of her face. Dr. Bradly Chris came and evaluated her. We gave her 50mg  of Benadryl and her symptoms improved. SPM I gave the patient of isovue 300. As i was getting her up after the scan she was itching the Left side of her face. She had developed two hives on the left side of her face. Dr. Bradly Chris came and evaluated her. We gave her 50mg  of Benadryl and her symptoms improved. SPM    Metrizamide Hives and Itching    I gave the patient of isovue 300. As i was getting her up after the scan she was itching the Left side of her face. She had developed two hives on the left side of her face. Dr. Bradly Chris came and evaluated her. We gave her 50mg  of Benadryl and her symptoms improved. SPM     REVIEW OF SYSTEMS (Negative unless checked)   Constitutional: [] Weight loss  [] Fever  [] Chills Cardiac: [] Chest pain   [] Chest pressure   [] Palpitations   [] Shortness of breath when laying flat   [] Shortness of breath at rest   [x] Shortness of breath with exertion. Vascular:  [] Pain in legs with walking   [] Pain in legs at rest   [] Pain in legs when laying flat   [] Claudication   [] Pain in feet when walking  [] Pain in feet at rest  [] Pain in feet when laying flat   [] History of DVT   [] Phlebitis   [] Swelling in legs   [] Varicose  veins   [] Non-healing ulcers Pulmonary:   [] Uses home oxygen   [] Productive cough   [] Hemoptysis   [] Wheeze  [] COPD   [] Asthma Neurologic:  [] Dizziness  [] Blackouts   [] Seizures   [x] History of stroke   [] History of TIA  []   Aphasia   Temporary blindness   Dysphagia   Weakness or numbness in arms   Weakness or numbness in legs Musculoskeletal:  Arthritis   Joint swelling   Joint pain   Low back pain Hematologic:  Easy bruising  Easy bleeding   Hypercoagulable state   Anemic  Hepatitis Gastrointestinal:  Blood in stool   Vomiting blood  Gastroesophageal reflux/heartburn   Difficulty swallowing. Genitourinary:  Chronic kidney disease   Difficult urination  Frequent urination  Burning with urination   Blood in urine Skin:  Rashes   Ulcers   Wounds Psychological:  History of anxiety    History of major depression.    Physical Examination  Vitals:   11/05/22 1431  BP: 138/73  Pulse: 82  Resp: 16  Weight: 227 lb 6.4 oz (103.1 kg)   Body mass index is 34.58 kg/m. Gen:  WD/WN, NAD Head: [place]/AT, No temporalis wasting. Ear/Nose/Throat: Hearing grossly intact, nares w/o erythema or drainage, trachea midline Eyes: Conjunctiva clear. Sclera non-icteric Neck: Supple.  No bruit  Pulmonary:  Good air movement, equal and clear to auscultation bilaterally.  Cardiac: RRR, No JVD Vascular:  Vessel Right Left  Radial Palpable Palpable               Musculoskeletal: M/S 5/5 throughout.  No deformity or atrophy. No edema. Neurologic: CN 2-12 intact. Sensation grossly intact in extremities.  Symmetrical.  Speech is fluent. Motor exam as listed above. Psychiatric: Judgment intact, Mood & affect appropriate for pt's clinical situation. Dermatologic: No rashes or ulcers noted.  No cellulitis or open wounds.     CBC Lab Results  Component Value Date   WBC 7.8 03/11/2022   HGB 13.3 03/11/2022   HCT 40.5 03/11/2022   MCV 90.2 03/11/2022   PLT  279 03/11/2022    BMET    Component Value Date/Time   NA 137 03/11/2022 1358   NA 137 06/28/2012 0922   K 3.5 03/11/2022 1358   K 4.5 06/28/2012 0922   CL 106 03/11/2022 1358   CL 107 06/28/2012 0922   CO2 21 (L) 03/11/2022 1358   CO2 24 06/28/2012 0922   GLUCOSE 107 (H) 03/11/2022 1358   GLUCOSE 101 (H) 06/28/2012 0922   BUN 19 03/11/2022 1358   BUN 14 06/28/2012 0922   CREATININE 1.05 (H) 03/11/2022 1358   CREATININE 1.12 06/28/2012 0922   CALCIUM 9.8 03/11/2022 1358   CALCIUM 9.0 06/28/2012 0922   GFRNONAA 52 (L) 03/11/2022 1358   GFRNONAA 47 (L) 06/28/2012 0922   GFRAA 55 (L) 06/28/2012 0922   CrCl cannot be calculated (Patient's most recent lab result is older than the maximum 21 days allowed.).  COAG Lab Results  Component Value Date   INR 1.1 03/11/2022   INR 1.0 10/22/2021    Radiology No results found.   Assessment/Plan Carotid stenosis, symptomatic, with infarction (HCC) Carotid duplex today shows right carotid velocities which would fall in the 40 to 59% range but are likely within the realm of normal with a carotid stent.  Left carotid artery velocities remain in the 1 to 39% range.  Doing well.  Continue current medical regimen.  Recheck in 6 months with carotid duplex.  Essential hypertension blood pressure control important in reducing the progression of atherosclerotic disease. On appropriate oral medications.     Type 2 diabetes mellitus without complication, without long-term current use of insulin (HCC) blood glucose control important in reducing the progression of atherosclerotic disease. Also, involved in wound healing.  On appropriate medications.     Hyperlipidemia lipid control important in reducing the progression of atherosclerotic disease. Continue statin therapy   Festus Barren, MD  11/05/2022 3:11 PM    This note was created with Dragon medical transcription system.  Any errors from dictation are purely unintentional

## 2022-11-05 NOTE — Assessment & Plan Note (Signed)
Carotid duplex today shows right carotid velocities which would fall in the 40 to 59% range but are likely within the realm of normal with a carotid stent.  Left carotid artery velocities remain in the 1 to 39% range.  Doing well.  Continue current medical regimen.  Recheck in 6 months with carotid duplex.

## 2022-11-28 ENCOUNTER — Ambulatory Visit
Admission: RE | Admit: 2022-11-28 | Discharge: 2022-11-28 | Disposition: A | Discharge status: Home / Self Care | Payer: Medicare Other | Source: Ambulatory Visit | Attending: Infectious Diseases | Admitting: Infectious Diseases

## 2022-11-28 DIAGNOSIS — Z1231 Encounter for screening mammogram for malignant neoplasm of breast: Secondary | ICD-10-CM | POA: Insufficient documentation

## 2023-01-22 ENCOUNTER — Ambulatory Visit: Admit: 2023-01-22 | Payer: Medicare Other | Admitting: Ophthalmology

## 2023-01-22 SURGERY — PHACOEMULSIFICATION, CATARACT, WITH IOL INSERTION
Anesthesia: Topical | Laterality: Right

## 2023-01-30 ENCOUNTER — Encounter: Payer: Self-pay | Admitting: Ophthalmology

## 2023-01-31 NOTE — Anesthesia Preprocedure Evaluation (Addendum)
Anesthesia Evaluation  Patient identified by MRN, date of birth, ID band Patient awake    Reviewed: Allergy & Precautions, NPO status , Patient's Chart, lab work & pertinent test results  History of Anesthesia Complications Negative for: history of anesthetic complications  Airway Mallampati: III   Neck ROM: Full    Dental  (+) Implants   Pulmonary neg pulmonary ROS   Pulmonary exam normal breath sounds clear to auscultation       Cardiovascular hypertension, + Peripheral Vascular Disease (carotid stenosis s/p CEA)  Normal cardiovascular exam Rhythm:Regular Rate:Normal     Neuro/Psych CVA (10/2021, no residual deficits; on Plavix)    GI/Hepatic negative GI ROS,,,  Endo/Other  Prediabetes, obesity  Renal/GU      Musculoskeletal Gout    Abdominal   Peds  Hematology  (+) Blood dyscrasia, anemia   Anesthesia Other Findings     Reproductive/Obstetrics                             Anesthesia Physical Anesthesia Plan  ASA: 3  Anesthesia Plan: MAC   Post-op Pain Management:    Induction: Intravenous  PONV Risk Score and Plan: 2 and Treatment may vary due to age or medical condition, Midazolam and TIVA  Airway Management Planned: Natural Airway and Nasal Cannula  Additional Equipment:   Intra-op Plan:   Post-operative Plan:   Informed Consent: I have reviewed the patients History and Physical, chart, labs and discussed the procedure including the risks, benefits and alternatives for the proposed anesthesia with the patient or authorized representative who has indicated his/her understanding and acceptance.     Dental advisory given  Plan Discussed with: CRNA  Anesthesia Plan Comments: (LMA/GETA backup discussed.  Patient consented for risks of anesthesia including but not limited to:  - adverse reactions to medications - damage to eyes, teeth, lips or other oral mucosa -  nerve damage due to positioning  - sore throat or hoarseness - damage to heart, brain, nerves, lungs, other parts of body or loss of life  Informed patient about role of CRNA in peri- and intra-operative care.  Patient voiced understanding.)       Anesthesia Quick Evaluation

## 2023-02-05 ENCOUNTER — Ambulatory Visit: Admit: 2023-02-05 | Payer: Medicare Other | Admitting: Ophthalmology

## 2023-02-05 SURGERY — PHACOEMULSIFICATION, CATARACT, WITH IOL INSERTION
Anesthesia: Topical | Laterality: Left

## 2023-02-07 NOTE — Discharge Instructions (Signed)

## 2023-02-10 ENCOUNTER — Encounter: Payer: Self-pay | Admitting: Ophthalmology

## 2023-02-10 ENCOUNTER — Encounter
Admission: RE | Disposition: A | Discharge status: Home / Self Care | Payer: Self-pay | Source: Home / Self Care | Attending: Ophthalmology

## 2023-02-10 ENCOUNTER — Other Ambulatory Visit: Payer: Self-pay

## 2023-02-10 ENCOUNTER — Ambulatory Visit: Payer: Medicare Other | Admitting: Anesthesiology

## 2023-02-10 ENCOUNTER — Ambulatory Visit
Admission: RE | Admit: 2023-02-10 | Discharge: 2023-02-10 | Disposition: A | Discharge status: Home / Self Care | Payer: Medicare Other | Source: Home / Self Care | Attending: Ophthalmology | Admitting: Ophthalmology

## 2023-02-10 DIAGNOSIS — H2512 Age-related nuclear cataract, left eye: Secondary | ICD-10-CM | POA: Diagnosis present

## 2023-02-10 DIAGNOSIS — Z6833 Body mass index (BMI) 33.0-33.9, adult: Secondary | ICD-10-CM | POA: Diagnosis not present

## 2023-02-10 DIAGNOSIS — Z8673 Personal history of transient ischemic attack (TIA), and cerebral infarction without residual deficits: Secondary | ICD-10-CM | POA: Diagnosis not present

## 2023-02-10 DIAGNOSIS — Z7902 Long term (current) use of antithrombotics/antiplatelets: Secondary | ICD-10-CM | POA: Insufficient documentation

## 2023-02-10 DIAGNOSIS — R7303 Prediabetes: Secondary | ICD-10-CM | POA: Diagnosis not present

## 2023-02-10 DIAGNOSIS — I10 Essential (primary) hypertension: Secondary | ICD-10-CM | POA: Insufficient documentation

## 2023-02-10 DIAGNOSIS — E669 Obesity, unspecified: Secondary | ICD-10-CM | POA: Insufficient documentation

## 2023-02-10 DIAGNOSIS — I739 Peripheral vascular disease, unspecified: Secondary | ICD-10-CM | POA: Diagnosis not present

## 2023-02-10 HISTORY — DX: Cerebral infarction, unspecified: I63.9

## 2023-02-10 HISTORY — PX: CATARACT EXTRACTION W/PHACO: SHX586

## 2023-02-10 SURGERY — PHACOEMULSIFICATION, CATARACT, WITH IOL INSERTION
Anesthesia: Monitor Anesthesia Care | Site: Eye | Laterality: Left

## 2023-02-10 MED ORDER — LACTATED RINGERS IV SOLN: INTRAVENOUS | Status: DC

## 2023-02-10 MED ORDER — MOXIFLOXACIN HCL 0.5 % OP SOLN
OPHTHALMIC | Status: DC | PRN
  Administered 2023-02-10: .2 mL via OPHTHALMIC

## 2023-02-10 MED ORDER — SIGHTPATH DOSE#1 BSS IO SOLN
INTRAOCULAR | Status: DC | PRN
  Administered 2023-02-10: 15 mL via INTRAOCULAR

## 2023-02-10 MED ORDER — ARMC OPHTHALMIC DILATING DROPS
1.0000 | OPHTHALMIC | Status: DC | PRN
  Administered 2023-02-10 (×3): 1 via OPHTHALMIC

## 2023-02-10 MED ORDER — SODIUM CHLORIDE 0.9% FLUSH
INTRAVENOUS | Status: DC | PRN
  Administered 2023-02-10: 10 mL via INTRAVENOUS

## 2023-02-10 MED ORDER — TETRACAINE HCL 0.5 % OP SOLN
1.0000 [drp] | OPHTHALMIC | Status: DC | PRN
  Administered 2023-02-10 (×3): 1 [drp] via OPHTHALMIC

## 2023-02-10 MED ORDER — LIDOCAINE HCL (PF) 2 % IJ SOLN
INTRAOCULAR | Status: DC | PRN
  Administered 2023-02-10: 1 mL via INTRAOCULAR

## 2023-02-10 MED ORDER — MIDAZOLAM HCL 2 MG/2ML IJ SOLN
INTRAMUSCULAR | Status: DC | PRN
  Administered 2023-02-10: 1 mg via INTRAVENOUS

## 2023-02-10 MED ORDER — SIGHTPATH DOSE#1 NA HYALUR & NA CHOND-NA HYALUR IO KIT
PACK | INTRAOCULAR | Status: DC | PRN
  Administered 2023-02-10: 1 via OPHTHALMIC

## 2023-02-10 MED ORDER — FENTANYL CITRATE (PF) 100 MCG/2ML IJ SOLN
INTRAMUSCULAR | Status: DC | PRN
  Administered 2023-02-10: 25 ug via INTRAVENOUS

## 2023-02-10 MED ORDER — SIGHTPATH DOSE#1 BSS IO SOLN
INTRAOCULAR | Status: DC | PRN
  Administered 2023-02-10: 114 mL via OPHTHALMIC

## 2023-02-10 SURGICAL SUPPLY — 15 items
CANNULA ANT/CHMB 27G (MISCELLANEOUS) IMPLANT
CANNULA ANT/CHMB 27GA (MISCELLANEOUS)
CATARACT SUITE SIGHTPATH (MISCELLANEOUS) ×1
DISSECTOR HYDRO NUCLEUS 50X22 (MISCELLANEOUS) ×1 IMPLANT
FEE CATARACT SUITE SIGHTPATH (MISCELLANEOUS) ×1 IMPLANT
GLOVE SURG GAMMEX PI TX LF 7.5 (GLOVE) ×1 IMPLANT
GLOVE SURG SYN 8.5 E (GLOVE) ×1
GLOVE SURG SYN 8.5 PF PI (GLOVE) ×1 IMPLANT
LENS IOL TECNIS EYHANCE 21.5 (Intraocular Lens) IMPLANT
NDL FILTER BLUNT 18X1 1/2 (NEEDLE) ×1 IMPLANT
NEEDLE FILTER BLUNT 18X1 1/2 (NEEDLE) ×1
SUT ETHILON 10-0 CS-B-6CS-B-6 (SUTURE)
SUTURE EHLN 10-0 CS-B-6CS-B-6 (SUTURE) IMPLANT
SYR 3ML LL SCALE MARK (SYRINGE) ×1 IMPLANT
SYR 5ML LL (SYRINGE) ×1 IMPLANT

## 2023-02-10 NOTE — Op Note (Signed)
OPERATIVE NOTE  Betty Atkins [id number] 02/10/2023   PREOPERATIVE DIAGNOSIS:  Nuclear sclerotic cataract left eye.  H25.12   POSTOPERATIVE DIAGNOSIS:    Nuclear sclerotic cataract left eye.     PROCEDURE:  Phacoemusification with posterior chamber intraocular lens placement of the left eye   LENS:   Implant Name Type Inv. Item Serial No. Manufacturer Lot No. LRB No. Used Action  LENS IOL TECNIS EYHANCE 21.5 - [id number] Intraocular Lens LENS IOL TECNIS EYHANCE 21.5 [id number] [employer]  Left 1 Implanted      Procedure(s): CATARACT EXTRACTION PHACO AND INTRAOCULAR LENS PLACEMENT (IOC) LEFT DIABETIC (Left)  DIB00 +21.5   ULTRASOUND TIME: 0 minutes 46 seconds.  CDE 4.82   SURGEON:  Willey Blade, MD, MPH   ANESTHESIA:  Topical with tetracaine drops augmented with 1% preservative-free intracameral lidocaine.  ESTIMATED BLOOD LOSS: <1 mL   COMPLICATIONS:  None.   DESCRIPTION OF PROCEDURE:  The patient was identified in the holding room and transported to the operating room and placed in the supine position under the operating microscope.  The left eye was identified as the operative eye and it was prepped and draped in the usual sterile ophthalmic fashion.   A 1.0 millimeter clear-corneal paracentesis was made at the 5:00 position. 0.5 ml of preservative-free 1% lidocaine with epinephrine was injected into the anterior chamber.  The anterior chamber was filled with viscoelastic.  A 2.4 millimeter keratome was used to make a near-clear corneal incision at the 2:00 position.  A curvilinear capsulorrhexis was made with a cystotome and capsulorrhexis forceps.  Balanced salt solution was used to hydrodissect and hydrodelineate the nucleus.   Phacoemulsification was then used in stop and chop fashion to remove the lens nucleus and epinucleus.  The remaining cortex was then removed using the irrigation and aspiration handpiece. Viscoelastic was then placed into the capsular bag to distend  it for lens placement.  A lens was then injected into the capsular bag.  The remaining viscoelastic was aspirated.   Wounds were hydrated with balanced salt solution.  The anterior chamber was inflated to a physiologic pressure with balanced salt solution.  Intracameral vigamox 0.1 mL undiltued was injected into the eye and a drop placed onto the ocular surface.  No wound leaks were noted.  The patient was taken to the recovery room in stable condition without complications of anesthesia or surgery  Willey Blade 02/10/2023, 7:53 AM

## 2023-02-10 NOTE — Transfer of Care (Signed)
Immediate Anesthesia Transfer of Care Note  Patient: CYDNE GRAHN  Procedure(s) Performed: CATARACT EXTRACTION PHACO AND INTRAOCULAR LENS PLACEMENT (IOC) LEFT DIABETIC (Left)  Patient Location: PACU  Anesthesia Type:MAC  Level of Consciousness: awake, alert , and oriented  Airway & Oxygen Therapy: Patient Spontanous Breathing  Post-op Assessment: Report given to RN and Post -op Vital signs reviewed and stable  Post vital signs: Reviewed and stable  Last Vitals:  Vitals Value Taken Time  BP 111/51 02/10/23 0754  Temp 36.3 C 02/10/23 0754  Pulse 70 02/10/23 0754  Resp 12 02/10/23 0756  SpO2 97 % 02/10/23 0754  Vitals shown include unfiled device data.  Last Pain:  Vitals:   02/10/23 0754  TempSrc:   PainSc: 0-No pain         Complications: No notable events documented.

## 2023-02-10 NOTE — Anesthesia Postprocedure Evaluation (Signed)
Anesthesia Post Note  Patient: Betty Atkins  Procedure(s) Performed: CATARACT EXTRACTION PHACO AND INTRAOCULAR LENS PLACEMENT (IOC) LEFT DIABETIC  4.82  00:46.6 (Left: Eye)  Patient location during evaluation: PACU Anesthesia Type: MAC Level of consciousness: awake and alert, oriented and patient cooperative Pain management: pain level controlled Vital Signs Assessment: post-procedure vital signs reviewed and stable Respiratory status: spontaneous breathing, nonlabored ventilation and respiratory function stable Cardiovascular status: blood pressure returned to baseline and stable Postop Assessment: adequate PO intake Anesthetic complications: no   No notable events documented.   Last Vitals:  Vitals:   02/10/23 0754 02/10/23 0800  BP: (!) 111/51 114/64  Pulse: 70 68  Resp: 16 14  Temp: (!) 36.3 C   SpO2: 97% 95%    Last Pain:  Vitals:   02/10/23 0800  TempSrc:   PainSc: 0-No pain                 Reed Breech

## 2023-02-10 NOTE — H&P (Signed)
[facility]   Primary Care Physician:  Mick Sell, MD Ophthalmologist: Dr. Willey Blade  Pre-Procedure History & Physical: HPI:  Betty Atkins is a 89 y.o. female here for cataract surgery.   Past Medical History:  Diagnosis Date   Anemia    Arthritis    degenerative   Dyspnea    Gout involving toe    Hyperlipidemia    Hypertension    Pre-diabetes    Stroke [facility])     Past Surgical History:  Procedure Laterality Date   ABDOMINAL HYSTERECTOMY     CAROTID PTA/STENT INTERVENTION Right 11/19/2021   Procedure: CAROTID PTA/STENT INTERVENTION;  Surgeon: Annice Needy, MD;  Location: [facility] INVASIVE CV LAB;  Service: Cardiovascular;  Laterality: Right;   COLONOSCOPY WITH PROPOFOL N/A 05/02/2015   Procedure: COLONOSCOPY WITH PROPOFOL;  Surgeon: Christena Deem, MD;  Location: [facility] ENDOSCOPY;  Service: Endoscopy;  Laterality: N/A;   JOINT REPLACEMENT     right total hip    Prior to Admission medications   Medication Sig Start Date End Date Taking? Authorizing Provider  allopurinol (ZYLOPRIM) 100 MG tablet Take 100 mg by mouth daily. 05/29/17  Yes [provider]  amLODipine-benazepril (LOTREL) 5-20 MG capsule Take 1 capsule by mouth daily.   Yes [provider]  aspirin EC 81 MG EC tablet Take 1 tablet (81 mg total) by mouth daily. Swallow whole. 10/23/21  Yes Judithe Modest, NP  atorvastatin (LIPITOR) 80 MG tablet Take 1 tablet (80 mg total) by mouth daily. 10/24/21  Yes Harlon Ditty D, NP  Calcium Carbonate-Vit D-Min (CALTRATE PLUS PO) Take 1 tablet by mouth daily.   Yes [provider]  clobetasol ointment (TEMOVATE) 0.05 % Apply to affected area every night for 4 weeks, then every other day for 4 weeks and then twice a week for 4 weeks or until resolution. 11/16/19  Yes Schuman, Christanna R, MD  clopidogrel (PLAVIX) 75 MG tablet Take 1 tablet (75 mg total) by mouth daily. 01/28/22  Yes Georgiana Spinner, NP  docusate sodium (COLACE) 100 MG  capsule Take 1 capsule (100 mg total) by mouth 2 (two) times daily as needed for mild constipation. 10/23/21  Yes Harlon Ditty D, NP  hydrochlorothiazide (HYDRODIURIL) 25 MG tablet Take 25 mg by mouth daily.   Yes [provider]  Multiple Vitamin (MULTIVITAMIN) tablet Take 1 tablet by mouth daily.   Yes [provider]  timolol (TIMOPTIC) 0.5 % ophthalmic solution Place 1 drop into both eyes 2 (two) times daily. 11/09/19  Yes [provider]  vitamin B-12 (CYANOCOBALAMIN) 100 MCG tablet Take 100 mcg by mouth daily.   Yes [provider]  Vitamin D3 (VITAMIN D) 25 MCG tablet Take 1,000 Units by mouth daily.   Yes [provider]  cetirizine (ZYRTEC) 10 MG tablet Take by mouth as needed. Patient not taking: Reported on 01/30/2023    [provider]  Ciclopirox 1 % shampoo APP ON THE SKIN UTD Patient not taking: Reported on 02/10/2023 04/28/18   [provider]  oxyCODONE-acetaminophen (PERCOCET/ROXICET) 5-325 MG tablet Take 1 tablet by mouth every 6 (six) hours as needed for moderate pain. Patient not taking: Reported on 04/19/2022 11/20/21   Georgiana Spinner, NP    Allergies as of 01/22/2023 - Review Complete 11/05/2022  Allergen Reaction Noted   Iodinated contrast media Hives and Itching 01/04/2016   Metrizamide Hives and Itching 01/04/2016    Family History  Problem Relation Age of Onset  Breast cancer Sister 2   Diabetes Sister     Social History   Socioeconomic History   Marital status: Widowed    Spouse name: Not on file   Number of children: Not on file   Years of education: Not on file   Highest education level: Not on file  Occupational History   Not on file  Tobacco Use   Smoking status: Never   Smokeless tobacco: Never  Vaping Use   Vaping status: Never Used  Substance and Sexual Activity   Alcohol use: No   Drug use: No   Sexual activity: Not Currently    Birth control/protection: Post-menopausal  Other  Topics Concern   Not on file  Social History Narrative   Not on file   Social Determinants of Health   Financial Resource Strain: Low Risk  (09/13/2022)   Received from [facility], [facility]   Overall Financial Resource Strain (CARDIA)    Difficulty of Paying Living Expenses: Not hard at all  Food Insecurity: No Food Insecurity (09/13/2022)   Received from [facility], [facility]   Hunger Vital Sign    Worried About Running Out of Food in the Last Year: Never true    Ran Out of Food in the Last Year: Never true  Transportation Needs: No Transportation Needs (09/13/2022)   Received from [facility], [facility]   [facility] - Transportation    In the past 12 months, has lack of transportation kept you from medical appointments or from getting medications?: No    Lack of Transportation (Non-Medical): No  Physical Activity: Not on file  Stress: Not on file  Social Connections: Not on file  Intimate Partner Violence: Not on file    Review of Systems: See HPI, otherwise negative ROS  Physical Exam: BP (!) 180/76   Pulse 79   Temp (!) 97 F (36.1 C) (Temporal)   Resp 11   Wt 101.3 kg   SpO2 98%   BMI 33.95 kg/m  General:   Alert, cooperative in NAD Head:  Normocephalic and atraumatic. Respiratory:  Normal work of breathing. Cardiovascular:  RRR  Impression/Plan: Betty Atkins is here for cataract surgery.  Risks, benefits, limitations, and alternatives regarding cataract surgery have been reviewed with the patient.  Questions have been answered.  All parties agreeable.   Willey Blade, MD  02/10/2023, 7:11 AM

## 2023-02-11 ENCOUNTER — Encounter: Payer: Self-pay | Admitting: Ophthalmology

## 2023-02-17 ENCOUNTER — Encounter: Payer: Self-pay | Admitting: Ophthalmology

## 2023-02-17 NOTE — Anesthesia Preprocedure Evaluation (Addendum)
Anesthesia Evaluation  Patient identified by MRN, date of birth, ID band Patient awake    Reviewed: Allergy & Precautions, NPO status , Patient's Chart, lab work & pertinent test results  History of Anesthesia Complications Negative for: history of anesthetic complications  Airway Mallampati: III   Neck ROM: Full    Dental  (+) Implants   Pulmonary shortness of breath   Pulmonary exam normal breath sounds clear to auscultation       Cardiovascular hypertension, + Peripheral Vascular Disease (carotid stenosis s/p CEA)  Normal cardiovascular exam Rhythm:Regular Rate:Normal  10-24-21 1. Left ventricular ejection fraction, by estimation, is 60 to 65%. The  left ventricle has normal function. The left ventricle has no regional  wall motion abnormalities. Left ventricular diastolic parameters were  normal.   2. Right ventricular systolic function is normal. The right ventricular  size is normal.   3. The mitral valve is normal in structure. Trivial mitral valve  regurgitation.   4. The aortic valve is normal in structure. Aortic valve regurgitation is  not visualized.     Neuro/Psych 04-19-22 5 months status post right carotid stent placement for high-grade stenosis with previous stroke.  She has done great.  She has had no periprocedural complications and no neurologic symptoms since her procedure.  She denies any new problems today.  Her duplex shows velocities that would technically fall in the 40 to 59% range on the right side but with a carotid stent would be within the realm of normal.  Her left carotid velocities are in the 1 to 39% range.    CVA (10/2021, no residual deficits; on Plavix)    GI/Hepatic negative GI ROS,,,  Endo/Other  diabetes, Type 2  Prediabetes, obesity  Renal/GU Renal disease     Musculoskeletal Gout    Abdominal   Peds  Hematology  (+) Blood dyscrasia, anemia   Anesthesia Other  Findings Hyperlipidemia  Hypertension Anemia  Arthritis Gout involving toe  Dyspnea Pre-diabetes  Stroke [facility])  Previous cataract 02-10-23 Dr. Ronni Rumble    Reproductive/Obstetrics                              Anesthesia Physical Anesthesia Plan  ASA: 3  Anesthesia Plan: MAC   Post-op Pain Management:    Induction: Intravenous  PONV Risk Score and Plan: 2 and Treatment may vary due to age or medical condition, Midazolam and TIVA  Airway Management Planned: Natural Airway and Nasal Cannula  Additional Equipment:   Intra-op Plan:   Post-operative Plan:   Informed Consent: I have reviewed the patients History and Physical, chart, labs and discussed the procedure including the risks, benefits and alternatives for the proposed anesthesia with the patient or authorized representative who has indicated his/her understanding and acceptance.     Dental advisory given  Plan Discussed with: CRNA  Anesthesia Plan Comments: (Patient consented for risks of anesthesia including but not limited to:  - adverse reactions to medications - damage to eyes, teeth, lips or other oral mucosa - nerve damage due to positioning  - sore throat or hoarseness - Damage to heart, brain, nerves, lungs, other parts of body or loss of life  Patient voiced understanding.)        Anesthesia Quick Evaluation

## 2023-02-19 NOTE — Discharge Instructions (Signed)

## 2023-02-24 ENCOUNTER — Encounter
Admission: RE | Disposition: A | Discharge status: Home / Self Care | Payer: Self-pay | Source: Home / Self Care | Attending: Ophthalmology

## 2023-02-24 ENCOUNTER — Ambulatory Visit: Payer: Medicare Other | Admitting: Anesthesiology

## 2023-02-24 ENCOUNTER — Ambulatory Visit
Admission: RE | Admit: 2023-02-24 | Discharge: 2023-02-24 | Disposition: A | Discharge status: Home / Self Care | Payer: Medicare Other | Attending: Ophthalmology | Admitting: Ophthalmology

## 2023-02-24 ENCOUNTER — Other Ambulatory Visit: Payer: Self-pay

## 2023-02-24 ENCOUNTER — Encounter: Payer: Self-pay | Admitting: Ophthalmology

## 2023-02-24 DIAGNOSIS — N183 Chronic kidney disease, stage 3 unspecified: Secondary | ICD-10-CM | POA: Insufficient documentation

## 2023-02-24 DIAGNOSIS — E1122 Type 2 diabetes mellitus with diabetic chronic kidney disease: Secondary | ICD-10-CM | POA: Insufficient documentation

## 2023-02-24 DIAGNOSIS — E1136 Type 2 diabetes mellitus with diabetic cataract: Secondary | ICD-10-CM | POA: Diagnosis not present

## 2023-02-24 DIAGNOSIS — I129 Hypertensive chronic kidney disease with stage 1 through stage 4 chronic kidney disease, or unspecified chronic kidney disease: Secondary | ICD-10-CM | POA: Insufficient documentation

## 2023-02-24 DIAGNOSIS — Z8673 Personal history of transient ischemic attack (TIA), and cerebral infarction without residual deficits: Secondary | ICD-10-CM | POA: Insufficient documentation

## 2023-02-24 DIAGNOSIS — E1151 Type 2 diabetes mellitus with diabetic peripheral angiopathy without gangrene: Secondary | ICD-10-CM | POA: Diagnosis not present

## 2023-02-24 DIAGNOSIS — H2511 Age-related nuclear cataract, right eye: Secondary | ICD-10-CM | POA: Insufficient documentation

## 2023-02-24 HISTORY — DX: Chronic kidney disease, stage 3 unspecified: N18.30

## 2023-02-24 HISTORY — PX: CATARACT EXTRACTION W/PHACO: SHX586

## 2023-02-24 SURGERY — PHACOEMULSIFICATION, CATARACT, WITH IOL INSERTION
Anesthesia: Monitor Anesthesia Care | Laterality: Right

## 2023-02-24 MED ORDER — MOXIFLOXACIN HCL 0.5 % OP SOLN
OPHTHALMIC | Status: DC | PRN
  Administered 2023-02-24: .2 mL via OPHTHALMIC

## 2023-02-24 MED ORDER — LIDOCAINE HCL (PF) 2 % IJ SOLN
INTRAOCULAR | Status: DC | PRN
  Administered 2023-02-24: 1 mL via INTRAOCULAR

## 2023-02-24 MED ORDER — SIGHTPATH DOSE#1 BSS IO SOLN
INTRAOCULAR | Status: DC | PRN
  Administered 2023-02-24: 113 mL via OPHTHALMIC

## 2023-02-24 MED ORDER — MIDAZOLAM HCL 2 MG/2ML IJ SOLN
INTRAMUSCULAR | Status: DC | PRN
  Administered 2023-02-24: 1 mg via INTRAVENOUS

## 2023-02-24 MED ORDER — SIGHTPATH DOSE#1 BSS IO SOLN
INTRAOCULAR | Status: DC | PRN
  Administered 2023-02-24: 15 mL via INTRAOCULAR

## 2023-02-24 MED ORDER — LACTATED RINGERS IV SOLN: INTRAVENOUS | Status: DC

## 2023-02-24 MED ORDER — FENTANYL CITRATE (PF) 100 MCG/2ML IJ SOLN
INTRAMUSCULAR | Status: DC | PRN
  Administered 2023-02-24: 25 ug via INTRAVENOUS

## 2023-02-24 MED ORDER — TETRACAINE HCL 0.5 % OP SOLN
1.0000 [drp] | OPHTHALMIC | Status: DC | PRN
  Administered 2023-02-24 (×3): 1 [drp] via OPHTHALMIC

## 2023-02-24 MED ORDER — SIGHTPATH DOSE#1 NA HYALUR & NA CHOND-NA HYALUR IO KIT
PACK | INTRAOCULAR | Status: DC | PRN
  Administered 2023-02-24: 1 via OPHTHALMIC

## 2023-02-24 MED ORDER — ARMC OPHTHALMIC DILATING DROPS
1.0000 | OPHTHALMIC | Status: DC | PRN
  Administered 2023-02-24 (×3): 1 via OPHTHALMIC

## 2023-02-24 SURGICAL SUPPLY — 11 items
CATARACT SUITE SIGHTPATH (MISCELLANEOUS) ×1 IMPLANT
DISSECTOR HYDRO NUCLEUS 50X22 (MISCELLANEOUS) ×1 IMPLANT
FEE CATARACT SUITE SIGHTPATH (MISCELLANEOUS) ×1 IMPLANT
GLOVE SURG GAMMEX PI TX LF 7.5 (GLOVE) ×1 IMPLANT
GLOVE SURG SYN 8.5 E (GLOVE) ×1 IMPLANT
GLOVE SURG SYN 8.5 PF PI (GLOVE) ×1 IMPLANT
LENS IOL TECNIS EYHANCE 20.5 (Intraocular Lens) IMPLANT
NDL FILTER BLUNT 18X1 1/2 (NEEDLE) ×1 IMPLANT
NEEDLE FILTER BLUNT 18X1 1/2 (NEEDLE) ×1 IMPLANT
SYR 3ML LL SCALE MARK (SYRINGE) ×1 IMPLANT
SYR 5ML LL (SYRINGE) ×1 IMPLANT

## 2023-02-24 NOTE — H&P (Signed)
[facility]   Primary Care Physician:  Mick Sell, MD Ophthalmologist: Dr. Willey Blade  Pre-Procedure History & Physical: HPI:  Betty Atkins is a 89 y.o. female here for cataract surgery.   Past Medical History:  Diagnosis Date   Anemia    Arthritis    degenerative   Chronic renal impairment, stage 3 (moderate), unspecified whether stage 3a or 3b CKD (HCC)    Dyspnea    Gout involving toe    Hyperlipidemia    Hypertension    Pre-diabetes    Stroke [facility])     Past Surgical History:  Procedure Laterality Date   ABDOMINAL HYSTERECTOMY     CAROTID PTA/STENT INTERVENTION Right 11/19/2021   Procedure: CAROTID PTA/STENT INTERVENTION;  Surgeon: Annice Needy, MD;  Location: [facility] INVASIVE CV LAB;  Service: Cardiovascular;  Laterality: Right;   CATARACT EXTRACTION W/PHACO Left 02/10/2023   Procedure: CATARACT EXTRACTION PHACO AND INTRAOCULAR LENS PLACEMENT (IOC) LEFT DIABETIC  4.82  00:46.6;  Surgeon: Nevada Crane, MD;  Location: [facility]ERY [facility];  Service: Ophthalmology;  Laterality: Left;   COLONOSCOPY WITH PROPOFOL N/A 05/02/2015   Procedure: COLONOSCOPY WITH PROPOFOL;  Surgeon: Christena Deem, MD;  Location: [facility] ENDOSCOPY;  Service: Endoscopy;  Laterality: N/A;   JOINT REPLACEMENT     right total hip    Prior to Admission medications   Medication Sig Start Date End Date Taking? Authorizing Provider  allopurinol (ZYLOPRIM) 100 MG tablet Take 100 mg by mouth daily. 05/29/17  Yes [provider]  amLODipine-benazepril (LOTREL) 5-20 MG capsule Take 1 capsule by mouth daily.   Yes [provider]  aspirin EC 81 MG EC tablet Take 1 tablet (81 mg total) by mouth daily. Swallow whole. 10/23/21  Yes Judithe Modest, NP  atorvastatin (LIPITOR) 80 MG tablet Take 1 tablet (80 mg total) by mouth daily. 10/24/21  Yes Harlon Ditty D, NP  Calcium Carbonate-Vit D-Min (CALTRATE PLUS PO) Take 1 tablet by mouth daily.   Yes [provider]   clobetasol ointment (TEMOVATE) 0.05 % Apply to affected area every night for 4 weeks, then every other day for 4 weeks and then twice a week for 4 weeks or until resolution. 11/16/19  Yes Schuman, Christanna R, MD  clopidogrel (PLAVIX) 75 MG tablet Take 1 tablet (75 mg total) by mouth daily. 01/28/22  Yes Georgiana Spinner, NP  docusate sodium (COLACE) 100 MG capsule Take 1 capsule (100 mg total) by mouth 2 (two) times daily as needed for mild constipation. 10/23/21  Yes Harlon Ditty D, NP  hydrochlorothiazide (HYDRODIURIL) 25 MG tablet Take 25 mg by mouth daily.   Yes [provider]  Multiple Vitamin (MULTIVITAMIN) tablet Take 1 tablet by mouth daily.   Yes [provider]  timolol (TIMOPTIC) 0.5 % ophthalmic solution Place 1 drop into both eyes 2 (two) times daily. 11/09/19  Yes [provider]  vitamin B-12 (CYANOCOBALAMIN) 100 MCG tablet Take 100 mcg by mouth daily.   Yes [provider]  Vitamin D3 (VITAMIN D) 25 MCG tablet Take 1,000 Units by mouth daily.   Yes [provider]  cetirizine (ZYRTEC) 10 MG tablet Take by mouth as needed. Patient not taking: Reported on 01/30/2023    [provider]  Ciclopirox 1 % shampoo APP ON THE SKIN UTD Patient not taking: Reported on 02/10/2023 04/28/18   [provider]  oxyCODONE-acetaminophen (PERCOCET/ROXICET) 5-325 MG tablet Take 1 tablet by mouth every 6 (six) hours as needed  for moderate pain. Patient not taking: Reported on 04/19/2022 11/20/21   Georgiana Spinner, NP    Allergies as of 01/22/2023 - Review Complete 11/05/2022  Allergen Reaction Noted   Iodinated contrast media Hives and Itching 01/04/2016   Metrizamide Hives and Itching 01/04/2016    Family History  Problem Relation Age of Onset   Breast cancer Sister 102   Diabetes Sister     Social History   Socioeconomic History   Marital status: Widowed    Spouse name: Not on file   Number of children: Not on file   Years of  education: Not on file   Highest education level: Not on file  Occupational History   Not on file  Tobacco Use   Smoking status: Never   Smokeless tobacco: Never  Vaping Use   Vaping status: Never Used  Substance and Sexual Activity   Alcohol use: No   Drug use: No   Sexual activity: Not Currently    Birth control/protection: Post-menopausal  Other Topics Concern   Not on file  Social History Narrative   Not on file   Social Determinants of Health   Financial Resource Strain: Low Risk  (09/13/2022)   [facility] from [facility], [facility]   Overall Financial Resource Strain (CARDIA)    Difficulty of Paying Living Expenses: Not hard at all  Food Insecurity: No Food Insecurity (09/13/2022)   Received from [facility], [facility]   Hunger Vital Sign    Worried About Running Out of Food in the Last Year: Never true    Ran Out of Food in the Last Year: Never true  Transportation Needs: No Transportation Needs (09/13/2022)   Received from [facility], [facility]   [facility] - Transportation    In the past 12 months, has lack of transportation kept you from medical appointments or from getting medications?: No    Lack of Transportation (Non-Medical): No  Physical Activity: Not on file  Stress: Not on file  Social Connections: Not on file  Intimate Partner Violence: Not on file    Review of Systems: See HPI, otherwise negative ROS  Physical Exam: BP (!) 155/71   Pulse 72   Temp (!) 97.4 F (36.3 C) (Temporal)   Resp 17   Ht 5\' 8"  (1.727 m)   Wt 100.4 kg   SpO2 96%   BMI 33.65 kg/m  General:   Alert, cooperative in NAD Head:  Normocephalic and atraumatic. Respiratory:  Normal work of breathing. Cardiovascular:  RRR  Impression/Plan: Betty Atkins is here for cataract surgery.  Risks, benefits, limitations, and alternatives regarding cataract surgery have  been reviewed with the patient.  Questions have been answered.  All parties agreeable.   Willey Blade, MD  02/24/2023, 10:22 AM

## 2023-02-24 NOTE — Anesthesia Postprocedure Evaluation (Signed)
Anesthesia Post Note  Patient: Betty Atkins  Procedure(s) Performed: CATARACT EXTRACTION PHACO AND INTRAOCULAR LENS PLACEMENT (IOC) RIGHT 5.19 00:46.5 (Right)  Patient location during evaluation: PACU Anesthesia Type: MAC Level of consciousness: awake and alert Pain management: pain level controlled Vital Signs Assessment: post-procedure vital signs reviewed and stable Respiratory status: spontaneous breathing, nonlabored ventilation, respiratory function stable and patient connected to nasal cannula oxygen Cardiovascular status: blood pressure returned to baseline and stable Postop Assessment: no apparent nausea or vomiting Anesthetic complications: no   No notable events documented.   Last Vitals:  Vitals:   02/24/23 1052 02/24/23 1100  BP: (!) 133/59 121/61  Pulse: 70 68  Resp: 17 19  Temp: (!) 36.1 C (!) 36.1 C  SpO2: 99% 98%    Last Pain:  Vitals:   02/24/23 1100  TempSrc:   PainSc: 0-No pain                 Cleda Mccreedy 

## 2023-02-24 NOTE — Op Note (Signed)
OPERATIVE NOTE  Betty Atkins [id number] 02/24/2023   PREOPERATIVE DIAGNOSIS:  Nuclear sclerotic cataract right eye.  H25.11   POSTOPERATIVE DIAGNOSIS:    Nuclear sclerotic cataract right eye.     PROCEDURE:  Phacoemusification with posterior chamber intraocular lens placement of the right eye   LENS:   Implant Name Type Inv. Item Serial No. Manufacturer Lot No. LRB No. Used Action  LENS IOL TECNIS EYHANCE 20.5 - [id number] Intraocular Lens LENS IOL TECNIS EYHANCE 20.5 [id number] [employer]  Right 1 Implanted       Procedure(s): CATARACT EXTRACTION PHACO AND INTRAOCULAR LENS PLACEMENT (IOC) RIGHT 5.19 00:46.5 (Right)  DIB00 +20.5   ULTRASOUND TIME: 0 minutes 46 seconds.  CDE 5.19   SURGEON:  Willey Blade, MD, MPH  ANESTHESIOLOGIST: Anesthesiologist: Piscitello, Cleda Mccreedy, MD CRNA: Lynden Oxford, CRNA   ANESTHESIA:  Topical with tetracaine drops augmented with 1% preservative-free intracameral lidocaine.  ESTIMATED BLOOD LOSS: less than 1 mL.   COMPLICATIONS:  None.   DESCRIPTION OF PROCEDURE:  The patient was identified in the holding room and transported to the operating room and placed in the supine position under the operating microscope.  The right eye was identified as the operative eye and it was prepped and draped in the usual sterile ophthalmic fashion.   A 1.0 millimeter clear-corneal paracentesis was made at the 10:30 position. 0.5 ml of preservative-free 1% lidocaine with epinephrine was injected into the anterior chamber.  The anterior chamber was filled with viscoelastic.  A 2.4 millimeter keratome was used to make a near-clear corneal incision at the 8:00 position.  A curvilinear capsulorrhexis was made with a cystotome and capsulorrhexis forceps.  Balanced salt solution was used to hydrodissect and hydrodelineate the nucleus.   Phacoemulsification was then used in stop and chop fashion to remove the lens nucleus and epinucleus.  The remaining cortex was  then removed using the irrigation and aspiration handpiece. Viscoelastic was then placed into the capsular bag to distend it for lens placement.  A lens was then injected into the capsular bag.  The remaining viscoelastic was aspirated.   Wounds were hydrated with balanced salt solution.  The anterior chamber was inflated to a physiologic pressure with balanced salt solution.   Intracameral vigamox 0.1 mL undiluted was injected into the eye and a drop placed onto the ocular surface.  No wound leaks were noted.  The patient was taken to the recovery room in stable condition without complications of anesthesia or surgery  Willey Blade 02/24/2023, 10:51 AM

## 2023-02-24 NOTE — Transfer of Care (Signed)
Immediate Anesthesia Transfer of Care Note  Patient: Betty Atkins  Procedure(s) Performed: CATARACT EXTRACTION PHACO AND INTRAOCULAR LENS PLACEMENT (IOC) RIGHT 5.19 00:46.5 (Right)  Patient Location: PACU  Anesthesia Type: MAC  Level of Consciousness: awake, alert  and patient cooperative  Airway and Oxygen Therapy: Patient Spontanous Breathing and Patient connected to supplemental oxygen  Post-op Assessment: Post-op Vital signs reviewed, Patient's Cardiovascular Status Stable, Respiratory Function Stable, Patent Airway and No signs of Nausea or vomiting  Post-op Vital Signs: Reviewed and stable  Complications: No notable events documented.

## 2023-02-25 ENCOUNTER — Encounter: Payer: Self-pay | Admitting: Ophthalmology

## 2023-05-12 ENCOUNTER — Other Ambulatory Visit (INDEPENDENT_AMBULATORY_CARE_PROVIDER_SITE_OTHER): Payer: Self-pay | Admitting: Vascular Surgery

## 2023-05-12 DIAGNOSIS — M79606 Pain in leg, unspecified: Secondary | ICD-10-CM

## 2023-05-13 ENCOUNTER — Ambulatory Visit (INDEPENDENT_AMBULATORY_CARE_PROVIDER_SITE_OTHER): Payer: Medicare Other

## 2023-05-13 ENCOUNTER — Ambulatory Visit (INDEPENDENT_AMBULATORY_CARE_PROVIDER_SITE_OTHER): Payer: Medicare Other | Admitting: Vascular Surgery

## 2023-05-13 ENCOUNTER — Encounter (INDEPENDENT_AMBULATORY_CARE_PROVIDER_SITE_OTHER): Payer: Self-pay | Admitting: Vascular Surgery

## 2023-05-13 VITALS — BP 132/72 | HR 76 | Resp 16 | Wt 223.4 lb

## 2023-05-13 DIAGNOSIS — I10 Essential (primary) hypertension: Secondary | ICD-10-CM

## 2023-05-13 DIAGNOSIS — I63239 Cerebral infarction due to unspecified occlusion or stenosis of unspecified carotid arteries: Secondary | ICD-10-CM | POA: Diagnosis not present

## 2023-05-13 DIAGNOSIS — E119 Type 2 diabetes mellitus without complications: Secondary | ICD-10-CM | POA: Diagnosis not present

## 2023-05-13 DIAGNOSIS — E785 Hyperlipidemia, unspecified: Secondary | ICD-10-CM | POA: Diagnosis not present

## 2023-05-13 DIAGNOSIS — M79606 Pain in leg, unspecified: Secondary | ICD-10-CM | POA: Diagnosis not present

## 2023-05-13 NOTE — Progress Notes (Unsigned)
MRN : [id number]  Betty Atkins is a 89 y.o. (1932/09/05) female who presents with chief complaint of  Chief Complaint  Patient presents with   Follow-up    3 month carotid ultrasound follow up  .  History of Present Illness: Patient returns today in follow up of her carotid disease.  She is about 16 months status post right carotid stent placement for high-grade stenosis with previous stroke.  She did well with the procedure.  She has had no further focal neurologic symptoms since the procedure.  She remains on aspirin, Plavix, and Lipitor.  She is doing well today without any new complaints. Carotid duplex demonstrates right carotid stent with good flow.  There are mildly elevated velocities that could fall in the 40 to 59% range, but on the lower end of this.  The left carotid stenosis is in the 1 to 39% range.  Current Outpatient Medications  Medication Sig Dispense Refill   allopurinol (ZYLOPRIM) 100 MG tablet Take 100 mg by mouth daily.     amLODipine-benazepril (LOTREL) 5-20 MG capsule Take 1 capsule by mouth daily.     aspirin EC 81 MG EC tablet Take 1 tablet (81 mg total) by mouth daily. Swallow whole. 30 tablet 11   atorvastatin (LIPITOR) 80 MG tablet Take 1 tablet (80 mg total) by mouth daily. 30 tablet 0   Calcium Carbonate-Vit D-Min (CALTRATE PLUS PO) Take 1 tablet by mouth daily.     clobetasol ointment (TEMOVATE) 0.05 % Apply to affected area every night for 4 weeks, then every other day for 4 weeks and then twice a week for 4 weeks or until resolution. 30 g 5   clopidogrel (PLAVIX) 75 MG tablet Take 1 tablet (75 mg total) by mouth daily. 30 tablet 5   docusate sodium (COLACE) 100 MG capsule Take 1 capsule (100 mg total) by mouth 2 (two) times daily as needed for mild constipation. 10 capsule 0   hydrochlorothiazide (HYDRODIURIL) 25 MG tablet Take 25 mg by mouth daily.     Multiple Vitamin (MULTIVITAMIN) tablet Take 1 tablet by mouth daily.     timolol (TIMOPTIC) 0.5 %  ophthalmic solution Place 1 drop into both eyes 2 (two) times daily.     vitamin B-12 (CYANOCOBALAMIN) 100 MCG tablet Take 100 mcg by mouth daily.     Vitamin D3 (VITAMIN D) 25 MCG tablet Take 1,000 Units by mouth daily.     cetirizine (ZYRTEC) 10 MG tablet Take by mouth as needed. (Patient not taking: Reported on 01/30/2023)     Ciclopirox 1 % shampoo APP ON THE SKIN UTD (Patient not taking: Reported on 02/10/2023)     oxyCODONE-acetaminophen (PERCOCET/ROXICET) 5-325 MG tablet Take 1 tablet by mouth every 6 (six) hours as needed for moderate pain. (Patient not taking: Reported on 04/19/2022) 30 tablet 0   No current facility-administered medications for this visit.    Past Medical History:  Diagnosis Date   Anemia    Arthritis    degenerative   Chronic renal impairment, stage 3 (moderate), unspecified whether stage 3a or 3b CKD (HCC)    Dyspnea    Gout involving toe    Hyperlipidemia    Hypertension    Pre-diabetes    Stroke [facility])     Past Surgical History:  Procedure Laterality Date   ABDOMINAL HYSTERECTOMY     CAROTID PTA/STENT INTERVENTION Right 11/19/2021   Procedure: CAROTID PTA/STENT INTERVENTION;  Surgeon: Annice Needy, MD;  Location: [facility] INVASIVE CV  LAB;  Service: Cardiovascular;  Laterality: Right;   CATARACT EXTRACTION W/PHACO Left 02/10/2023   Procedure: CATARACT EXTRACTION PHACO AND INTRAOCULAR LENS PLACEMENT (IOC) LEFT DIABETIC  4.82  00:46.6;  Surgeon: Nevada Crane, MD;  Location: [facility]ERY [facility];  Service: Ophthalmology;  Laterality: Left;   CATARACT EXTRACTION W/PHACO Right 02/24/2023   Procedure: CATARACT EXTRACTION PHACO AND INTRAOCULAR LENS PLACEMENT (IOC) RIGHT 5.19 00:46.5;  Surgeon: Nevada Crane, MD;  Location: [facility]ERY [facility];  Service: Ophthalmology;  Laterality: Right;   COLONOSCOPY WITH PROPOFOL N/A 05/02/2015   Procedure: COLONOSCOPY WITH PROPOFOL;  Surgeon: Christena Deem, MD;  Location: [facility] ENDOSCOPY;  Service: Endoscopy;  Laterality:  N/A;   JOINT REPLACEMENT     right total hip     Social History   Tobacco Use   Smoking status: Never   Smokeless tobacco: Never  Vaping Use   Vaping status: Never Used  Substance Use Topics   Alcohol use: No   Drug use: No      Family History  Problem Relation Age of Onset   Breast cancer Sister 16   Diabetes Sister      Allergies  Allergen Reactions   Iodinated Contrast Media Hives and Itching    I gave the patient of isovue 300. As i was getting her up after the scan she was itching the Left side of her face. She had developed two hives on the left side of her face. Dr. Bradly Chris came and evaluated her. We gave her 50mg  of Benadryl and her symptoms improved. SPM I gave the patient of isovue 300. As i was getting her up after the scan she was itching the Left side of her face. She had developed two hives on the left side of her face. Dr. Bradly Chris came and evaluated her. We gave her 50mg  of Benadryl and her symptoms improved. SPM I gave the patient of isovue 300. As i was getting her up after the scan she was itching the Left side of her face. She had developed two hives on the left side of her face. Dr. Bradly Chris came and evaluated her. We gave her 50mg  of Benadryl and her symptoms improved. SPM  I gave the patient of isovue 300. As i was getting her up after the scan she was itching the Left side of her face. She had developed two hives on the left side of her face. Dr. Bradly Chris came and evaluated her. We gave her 50mg  of Benadryl and her symptoms improved. SPM I gave the patient of isovue 300. As i was getting her up after the scan she was itching the Left side of her face. She had developed two hives on the left side of her face. Dr. Bradly Chris came and evaluated her. We gave her 50mg  of Benadryl and her symptoms improved. SPM I gave the patient of isovue 300. As i was getting her up after the scan she was itching the Left side of her face. She had  developed two hives on the left side of her face. Dr. Bradly Chris came and evaluated her. We gave her 50mg  of Benadryl and her symptoms improved. SPM    Metrizamide Hives and Itching    I gave the patient of isovue 300. As i was getting her up after the scan she was itching the Left side of her face. She had developed two hives on the left side of her face. Dr. Bradly Chris came and evaluated her. We  gave her 50mg  of Benadryl and her symptoms improved. SPM     REVIEW OF SYSTEMS (Negative unless checked)   Constitutional: [] Weight loss  [] Fever  [] Chills Cardiac: [] Chest pain   [] Chest pressure   [] Palpitations   [] Shortness of breath when laying flat   [] Shortness of breath at rest   [x] Shortness of breath with exertion. Vascular:  [] Pain in legs with walking   [] Pain in legs at rest   [] Pain in legs when laying flat   [] Claudication   [] Pain in feet when walking  [] Pain in feet at rest  [] Pain in feet when laying flat   [] History of DVT   [] Phlebitis   [] Swelling in legs   [] Varicose veins   [] Non-healing ulcers Pulmonary:   [] Uses home oxygen   [] Productive cough   [] Hemoptysis   [] Wheeze  [] COPD   [] Asthma Neurologic:  [] Dizziness  [] Blackouts   [] Seizures   [x] History of stroke   [] History of TIA  [] Aphasia   [] Temporary blindness   [] Dysphagia   [] Weakness or numbness in arms   [] Weakness or numbness in legs Musculoskeletal:  [x] Arthritis   [] Joint swelling   [x] Joint pain   [] Low back pain Hematologic:  [] Easy bruising  [] Easy bleeding   [] Hypercoagulable state   [] Anemic  [] Hepatitis Gastrointestinal:  [] Blood in stool   [] Vomiting blood  [] Gastroesophageal reflux/heartburn   [] Difficulty swallowing. Genitourinary:  [x] Chronic kidney disease   [] Difficult urination  [] Frequent urination  [] Burning with urination   [] Blood in urine Skin:  [] Rashes   [] Ulcers   [] Wounds Psychological:  [] History of anxiety   []  History of major depression.  Physical Examination  BP 132/72 (BP Location: Left  Arm)   Pulse 76   Resp 16   Wt 223 lb 6.4 oz (101.3 kg)   BMI 33.97 kg/m  Gen:  WD/WN, NAD. Appears far younger than stated age. Head: [place]/AT, No temporalis wasting. Ear/Nose/Throat: Hearing grossly intact, nares w/o erythema or drainage Eyes: Conjunctiva clear. Sclera non-icteric Neck: Supple.  Trachea midline. No bruit Pulmonary:  Good air movement, no use of accessory muscles.  Cardiac: RRR, no JVD Vascular:  Vessel Right Left  Radial Palpable Palpable                          PT Palpable Palpable  DP Palpable Palpable   Gastrointestinal: soft, non-tender/non-distended. No guarding/reflex.  Musculoskeletal: M/S 5/5 throughout.  No deformity or atrophy. No edema. Neurologic: Sensation grossly intact in extremities.  Symmetrical.  Speech is fluent.  Psychiatric: Judgment intact, Mood & affect appropriate for pt's clinical situation. Dermatologic: No rashes or ulcers noted.  No cellulitis or open wounds.      Labs No results found for this or any previous visit (from the past 2160 hour(s)).  Radiology No results found.  Assessment/Plan  Carotid stenosis, symptomatic, with infarction (HCC) Carotid duplex demonstrates right carotid stent with good flow.  There are mildly elevated velocities that could fall in the 40 to 59% range, but on the lower end of this.  The left carotid stenosis is in the 1 to 39% range.  Continue current medical regimen.  Follow-up in 1 year with carotid duplex.  Essential hypertension blood pressure control important in reducing the progression of atherosclerotic disease. On appropriate oral medications.     Type 2 diabetes mellitus without complication, without long-term current use of insulin (HCC) blood glucose control important in reducing the progression of atherosclerotic disease. Also, involved in wound  healing. On appropriate medications.     Hyperlipidemia lipid control important in reducing the progression of atherosclerotic  disease. Continue statin therapy  Festus Barren, MD  05/14/2023 12:57 PM    This note was created with Dragon medical transcription system.  Any errors from dictation are purely unintentional

## 2023-05-14 NOTE — Assessment & Plan Note (Signed)
Carotid duplex demonstrates right carotid stent with good flow.  There are mildly elevated velocities that could fall in the 40 to 59% range, but on the lower end of this.  The left carotid stenosis is in the 1 to 39% range.  Continue current medical regimen.  Follow-up in 1 year with carotid duplex.

## 2023-05-15 LAB — VAS US ABI WITH/WO TBI
Left ABI: 1.01
Right ABI: 1.21

## 2023-11-11 ENCOUNTER — Ambulatory Visit (INDEPENDENT_AMBULATORY_CARE_PROVIDER_SITE_OTHER): Payer: PRIVATE HEALTH INSURANCE | Admitting: Nurse Practitioner

## 2023-11-11 ENCOUNTER — Ambulatory Visit (INDEPENDENT_AMBULATORY_CARE_PROVIDER_SITE_OTHER): Payer: Medicare Other

## 2023-11-11 DIAGNOSIS — I63239 Cerebral infarction due to unspecified occlusion or stenosis of unspecified carotid arteries: Secondary | ICD-10-CM

## 2024-05-12 ENCOUNTER — Other Ambulatory Visit (INDEPENDENT_AMBULATORY_CARE_PROVIDER_SITE_OTHER): Payer: Self-pay | Admitting: Vascular Surgery

## 2024-05-12 DIAGNOSIS — I6523 Occlusion and stenosis of bilateral carotid arteries: Secondary | ICD-10-CM

## 2024-05-14 ENCOUNTER — Ambulatory Visit (INDEPENDENT_AMBULATORY_CARE_PROVIDER_SITE_OTHER): Admitting: Vascular Surgery

## 2024-05-14 ENCOUNTER — Ambulatory Visit (INDEPENDENT_AMBULATORY_CARE_PROVIDER_SITE_OTHER)

## 2024-05-14 ENCOUNTER — Encounter (INDEPENDENT_AMBULATORY_CARE_PROVIDER_SITE_OTHER): Payer: Self-pay | Admitting: Vascular Surgery

## 2024-05-14 VITALS — BP 135/77 | HR 81 | Resp 18 | Ht 67.0 in | Wt 216.0 lb

## 2024-05-14 DIAGNOSIS — I6523 Occlusion and stenosis of bilateral carotid arteries: Secondary | ICD-10-CM | POA: Diagnosis not present

## 2024-05-14 DIAGNOSIS — I10 Essential (primary) hypertension: Secondary | ICD-10-CM

## 2024-05-14 DIAGNOSIS — E119 Type 2 diabetes mellitus without complications: Secondary | ICD-10-CM | POA: Diagnosis not present

## 2024-05-14 DIAGNOSIS — E785 Hyperlipidemia, unspecified: Secondary | ICD-10-CM | POA: Diagnosis not present

## 2024-05-14 DIAGNOSIS — I63239 Cerebral infarction due to unspecified occlusion or stenosis of unspecified carotid arteries: Secondary | ICD-10-CM

## 2024-05-14 NOTE — Progress Notes (Signed)
 MRN : [id number]  Betty Atkins is a 89 y.o. (01-29-1933) female who presents with chief complaint of  Chief Complaint  Patient presents with   Follow-up    6 month follow up carotid  .  History of Present Illness: Patient returns in follow-up of her carotid disease.  She is doing well.  She denies any focal neurologic symptoms since her last visit.  Her carotid duplex today shows 1 to 39% ICA stenosis bilaterally by duplex criteria.  Current Outpatient Medications  Medication Sig Dispense Refill   allopurinol  (ZYLOPRIM ) 100 MG tablet Take 100 mg by mouth daily.     amLODipine -benazepril  (LOTREL) 5-20 MG capsule Take 1 capsule by mouth daily.     aspirin  EC 81 MG EC tablet Take 1 tablet (81 mg total) by mouth daily. Swallow whole. 30 tablet 11   atorvastatin  (LIPITOR) 80 MG tablet Take 1 tablet (80 mg total) by mouth daily. 30 tablet 0   Calcium  Carbonate-Vit D-Min (CALTRATE PLUS PO) Take 1 tablet by mouth daily.     cetirizine (ZYRTEC) 10 MG tablet Take by mouth as needed.     Ciclopirox 1 % shampoo APP ON THE SKIN UTD     clobetasol  ointment (TEMOVATE ) 0.05 % Apply to affected area every night for 4 weeks, then every other day for 4 weeks and then twice a week for 4 weeks or until resolution. 30 g 5   clopidogrel  (PLAVIX ) 75 MG tablet Take 1 tablet (75 mg total) by mouth daily. 30 tablet 5   docusate sodium  (COLACE) 100 MG capsule Take 1 capsule (100 mg total) by mouth 2 (two) times daily as needed for mild constipation. 10 capsule 0   hydrochlorothiazide  (HYDRODIURIL ) 25 MG tablet Take 25 mg by mouth daily.     Multiple Vitamin (MULTIVITAMIN) tablet Take 1 tablet by mouth daily.     oxyCODONE -acetaminophen  (PERCOCET/ROXICET) 5-325 MG tablet Take 1 tablet by mouth every 6 (six) hours as needed for moderate pain. 30 tablet 0   timolol  (TIMOPTIC ) 0.5 % ophthalmic solution Place 1 drop into both eyes 2 (two) times daily.     vitamin B-12 (CYANOCOBALAMIN) 100 MCG tablet Take 100 mcg  by mouth daily.     Vitamin D3 (VITAMIN D ) 25 MCG tablet Take 1,000 Units by mouth daily.     No current facility-administered medications for this visit.    Past Medical History:  Diagnosis Date   Anemia    Arthritis    degenerative   Chronic renal impairment, stage 3 (moderate), unspecified whether stage 3a or 3b CKD    Dyspnea    Gout involving toe    Hyperlipidemia    Hypertension    Pre-diabetes    Stroke [facility])     Past Surgical History:  Procedure Laterality Date   ABDOMINAL HYSTERECTOMY     CAROTID PTA/STENT INTERVENTION Right 11/19/2021   Procedure: CAROTID PTA/STENT INTERVENTION;  Surgeon: Marea Selinda RAMAN, MD;  Location: [facility] INVASIVE CV LAB;  Service: Cardiovascular;  Laterality: Right;   CATARACT EXTRACTION W/PHACO Left 02/10/2023   Procedure: CATARACT EXTRACTION PHACO AND INTRAOCULAR LENS PLACEMENT (IOC) LEFT DIABETIC  4.82  00:46.6;  Surgeon: Myrna Adine Anes, MD;  Location: [facility];  Service: Ophthalmology;  Laterality: Left;   CATARACT EXTRACTION W/PHACO Right 02/24/2023   Procedure: CATARACT EXTRACTION PHACO AND INTRAOCULAR LENS PLACEMENT (IOC) RIGHT 5.19 00:46.5;  Surgeon: Myrna Adine Anes, MD;  Location: [facility];  Service: Ophthalmology;  Laterality: Right;   COLONOSCOPY  WITH PROPOFOL  N/A 05/02/2015   Procedure: COLONOSCOPY WITH PROPOFOL ;  Surgeon: Gladis RAYMOND Mariner, MD;  Location: [facility] ENDOSCOPY;  Service: Endoscopy;  Laterality: N/A;   JOINT REPLACEMENT     right total hip     Social History   Tobacco Use   Smoking status: Never   Smokeless tobacco: Never  Vaping Use   Vaping status: Never Used  Substance Use Topics   Alcohol use: No   Drug use: No      Family History  Problem Relation Age of Onset   Breast cancer Sister 69   Diabetes Sister      Allergies  Allergen Reactions   Iodinated Contrast Media Hives and Itching    I gave the patient of isovue  300. As i was getting her up after the scan she was itching  the Left side of her face. She had developed two hives on the left side of her face. Dr. Joan came and evaluated her. We gave her 50mg  of Benadryl  and her symptoms improved. SPM I gave the patient of isovue  300. As i was getting her up after the scan she was itching the Left side of her face. She had developed two hives on the left side of her face. Dr. Joan came and evaluated her. We gave her 50mg  of Benadryl  and her symptoms improved. SPM I gave the patient of isovue  300. As i was getting her up after the scan she was itching the Left side of her face. She had developed two hives on the left side of her face. Dr. Joan came and evaluated her. We gave her 50mg  of Benadryl  and her symptoms improved. SPM  I gave the patient of isovue  300. As i was getting her up after the scan she was itching the Left side of her face. She had developed two hives on the left side of her face. Dr. Joan came and evaluated her. We gave her 50mg  of Benadryl  and her symptoms improved. SPM I gave the patient of isovue  300. As i was getting her up after the scan she was itching the Left side of her face. She had developed two hives on the left side of her face. Dr. Joan came and evaluated her. We gave her 50mg  of Benadryl  and her symptoms improved. SPM I gave the patient of isovue  300. As i was getting her up after the scan she was itching the Left side of her face. She had developed two hives on the left side of her face. Dr. Joan came and evaluated her. We gave her 50mg  of Benadryl  and her symptoms improved. SPM    Metrizamide Hives and Itching    I gave the patient of isovue  300. As i was getting her up after the scan she was itching the Left side of her face. She had developed two hives on the left side of her face. Dr. Joan came and evaluated her. We gave her 50mg  of Benadryl  and her symptoms improved. SPM      REVIEW OF SYSTEMS (Negative unless checked)   Constitutional:  [] Weight loss  [] Fever  [] Chills Cardiac: [] Chest pain   [] Chest pressure   [] Palpitations   [] Shortness of breath when laying flat   [] Shortness of breath at rest   [x] Shortness of breath with exertion. Vascular:  [] Pain in legs with walking   [] Pain in legs at rest   [] Pain in legs when laying flat   [] Claudication   []   Pain in feet when walking  [] Pain in feet at rest  [] Pain in feet when laying flat   [] History of DVT   [] Phlebitis   [] Swelling in legs   [] Varicose veins   [] Non-healing ulcers Pulmonary:   [] Uses home oxygen   [] Productive cough   [] Hemoptysis   [] Wheeze  [] COPD   [] Asthma Neurologic:  [] Dizziness  [] Blackouts   [] Seizures   [x] History of stroke   [] History of TIA  [] Aphasia   [] Temporary blindness   [] Dysphagia   [] Weakness or numbness in arms   [] Weakness or numbness in legs Musculoskeletal:  [x] Arthritis   [] Joint swelling   [x] Joint pain   [] Low back pain Hematologic:  [] Easy bruising  [] Easy bleeding   [] Hypercoagulable state   [] Anemic  [] Hepatitis Gastrointestinal:  [] Blood in stool   [] Vomiting blood  [] Gastroesophageal reflux/heartburn   [] Difficulty swallowing. Genitourinary:  [x] Chronic kidney disease   [] Difficult urination  [] Frequent urination  [] Burning with urination   [] Blood in urine Skin:  [] Rashes   [] Ulcers   [] Wounds Psychological:  [] History of anxiety   []  History of major depression.  Physical Examination  Vitals:   05/14/24 0935  BP: 135/77  Pulse: 81  Resp: 18  Weight: 216 lb (98 kg)  Height: 5' 7 (1.702 m)   Body mass index is 33.83 kg/m. Gen:  WD/WN, NAD Head: Vazquez/AT, No temporalis wasting. Ear/Nose/Throat: Hearing grossly intact, nares w/o erythema or drainage, trachea midline Eyes: Conjunctiva clear. Sclera non-icteric Neck: Supple.  No bruit  Pulmonary:  Good air movement, equal and clear to auscultation bilaterally.  Cardiac: RRR, No JVD Vascular:  Vessel Right Left  Radial Palpable Palpable           Musculoskeletal: M/S 5/5  throughout.  No deformity or atrophy. Trace LE edema. Neurologic: CN 2-12 intact. Sensation grossly intact in extremities.  Symmetrical.  Speech is fluent. Motor exam as listed above. Psychiatric: Judgment intact, Mood & affect appropriate for pt's clinical situation. Dermatologic: No rashes or ulcers noted.  No cellulitis or open wounds.     CBC Lab Results  Component Value Date   WBC 7.8 03/11/2022   HGB 13.3 03/11/2022   HCT 40.5 03/11/2022   MCV 90.2 03/11/2022   PLT 279 03/11/2022    BMET    Component Value Date/Time   NA 137 03/11/2022 1358   NA 137 06/28/2012 0922   K 3.5 03/11/2022 1358   K 4.5 06/28/2012 0922   CL 106 03/11/2022 1358   CL 107 06/28/2012 0922   CO2 21 (L) 03/11/2022 1358   CO2 24 06/28/2012 0922   GLUCOSE 107 (H) 03/11/2022 1358   GLUCOSE 101 (H) 06/28/2012 0922   BUN 19 03/11/2022 1358   BUN 14 06/28/2012 0922   CREATININE 1.05 (H) 03/11/2022 1358   CREATININE 1.12 06/28/2012 0922   CALCIUM  9.8 03/11/2022 1358   CALCIUM  9.0 06/28/2012 0922   GFRNONAA 52 (L) 03/11/2022 1358   GFRNONAA 47 (L) 06/28/2012 0922   GFRAA 55 (L) 06/28/2012 0922   CrCl cannot be calculated (Patient's most recent lab result is older than the maximum 21 days allowed.).  COAG Lab Results  Component Value Date   INR 1.1 03/11/2022   INR 1.0 10/22/2021    Radiology No results found.   Assessment/Plan Carotid stenosis, symptomatic, with infarction (HCC) Her carotid duplex today shows 1 to 39% ICA stenosis bilaterally by duplex criteria.  Already on dual antiplatelet therapy and high-dose statin agent.  Recheck on an annual  basis.  Essential hypertension blood pressure control important in reducing the progression of atherosclerotic disease. On appropriate oral medications.     Type 2 diabetes mellitus without complication, without long-term current use of insulin  (HCC) blood glucose control important in reducing the progression of atherosclerotic disease.  Also, involved in wound healing. On appropriate medications.     Hyperlipidemia lipid control important in reducing the progression of atherosclerotic disease. Continue statin therapy  Selinda Gu, MD  05/14/2024 10:12 AM    This note was created with Dragon medical transcription system.  Any errors from dictation are purely unintentional

## 2024-05-14 NOTE — Assessment & Plan Note (Signed)
 Her carotid duplex today shows 1 to 39% ICA stenosis bilaterally by duplex criteria.  Already on dual antiplatelet therapy and high-dose statin agent.  Recheck on an annual basis.

## 2024-06-22 ENCOUNTER — Ambulatory Visit: Admitting: Physical Therapy

## 2024-06-22 NOTE — Therapy (Deleted)
 OUTPATIENT PHYSICAL THERAPY NEURO EVALUATION   Patient Name: Betty Atkins MRN: [id number] DOB:August 08, 1932, 89 y.o., female Today's Date: 06/22/2024   PCP: Epifanio Alm SQUIBB, MD  REFERRING PROVIDER: Epifanio Alm SQUIBB, MD   END OF SESSION:   Past Medical History:  Diagnosis Date   Anemia    Arthritis    degenerative   Chronic renal impairment, stage 3 (moderate), unspecified whether stage 3a or 3b CKD    Dyspnea    Gout involving toe    Hyperlipidemia    Hypertension    Pre-diabetes    Stroke [facility])    Past Surgical History:  Procedure Laterality Date   ABDOMINAL HYSTERECTOMY     CAROTID PTA/STENT INTERVENTION Right 11/19/2021   Procedure: CAROTID PTA/STENT INTERVENTION;  Surgeon: Marea Selinda RAMAN, MD;  Location: [facility] INVASIVE CV LAB;  Service: Cardiovascular;  Laterality: Right;   CATARACT EXTRACTION W/PHACO Left 02/10/2023   Procedure: CATARACT EXTRACTION PHACO AND INTRAOCULAR LENS PLACEMENT (IOC) LEFT DIABETIC  4.82  00:46.6;  Surgeon: Myrna Adine Anes, MD;  Location: [facility];  Service: Ophthalmology;  Laterality: Left;   CATARACT EXTRACTION W/PHACO Right 02/24/2023   Procedure: CATARACT EXTRACTION PHACO AND INTRAOCULAR LENS PLACEMENT (IOC) RIGHT 5.19 00:46.5;  Surgeon: Myrna Adine Anes, MD;  Location: [facility];  Service: Ophthalmology;  Laterality: Right;   COLONOSCOPY WITH PROPOFOL  N/A 05/02/2015   Procedure: COLONOSCOPY WITH PROPOFOL ;  Surgeon: Gladis RAYMOND Mariner, MD;  Location: [facility] ENDOSCOPY;  Service: Endoscopy;  Laterality: N/A;   JOINT REPLACEMENT     right total hip   Patient Active Problem List   Diagnosis Date Noted   Carotid stenosis, symptomatic, with infarction (HCC) 11/13/2021   Muscle weakness of left upper extremity 10/22/2021   Stage 3a chronic kidney disease (HCC) 12/29/2019   Severe obesity (BMI 35.0-39.9) with comorbidity (HCC) 12/29/2019   Mixed hyperlipidemia 11/17/2017   Type 2 diabetes mellitus without complication,  without long-term current use of insulin  (HCC) 11/17/2017   Prediabetes 11/21/2016   History of colon polyps 01/31/2015   DDD (degenerative disc disease), lumbar 01/27/2015   H/O bilateral hip replacements 01/27/2015   Anemia 04/14/2014   Essential hypertension 04/14/2014   Hyperlipidemia 04/14/2014    ONSET DATE: ***  REFERRING DIAG:  R26.89 (ICD-10-CM) - Other abnormalities of gait and mobility  Z86.73 (ICD-10-CM) - Personal history of transient ischemic attack (TIA), and cerebral infarction without residual deficits    THERAPY DIAG:  No diagnosis found.  Rationale for Evaluation and Treatment: Rehabilitation  SUBJECTIVE:  SUBJECTIVE STATEMENT: *** Pt accompanied by: {accompnied:27141}  PERTINENT HISTORY: ***  PAIN:  Are you having pain? {OPRCPAIN:27236}  PRECAUTIONS: {Therapy precautions:24002}  RED FLAGS: {PT Red Flags:29287}   WEIGHT BEARING RESTRICTIONS: {Yes ***/No:24003}  FALLS: Has patient fallen in last 6 months? {fallsyesno:27318}  LIVING ENVIRONMENT: Lives with: {OPRC lives with:25569::lives with their family} Lives in: {Lives in:25570} Stairs: {opstairs:27293} Has following equipment at home: {Assistive devices:23999}  PLOF: {PLOF:24004}  PATIENT GOALS: ***  OBJECTIVE:  Note: Objective measures were completed at Evaluation unless otherwise noted.  DIAGNOSTIC FINDINGS:  From head CT 03/11/2022 Hx of stroke in April with 99 % blockage in neck Pt to ED via POV from home. Pt reports yesterday she started having a funny feeling/tingling in the back of her neck bilaterally. Pt reports she got up this morning she had that same feeling and intermittent tingling in the top of her head. Pt denies N/V, visual changes, speech changes or HA.  From Brain MRI 10/22/2021:  IMPRESSION: MRI brain: 1. Two subcentimeter acute infarcts within the mid-to-posterior right frontal lobe, within the right ACA territory or ACA/MCA watershed territory. 2. Additional subcentimeter acute infarct within the right parietal lobe, in the right MCA/PCA watershed territory. 3. Background moderate chronic small vessel ischemic changes within the cerebral white matter and pons, slightly progressed from the prior brain MRI of 12/15/2006.   COGNITION: Overall cognitive status: {cognition:24006}   SENSATION: {sensation:27233}  COORDINATION: ***  EDEMA:  {edema:24020}   POSTURE: {posture:25561}  LOWER EXTREMITY ROM:     {AROM/PROM:27142}  Right Eval Left Eval  Hip flexion    Hip extension    Hip abduction    Hip adduction    Hip internal rotation    Hip external rotation    Knee flexion    Knee extension    Ankle dorsiflexion    Ankle plantarflexion    Ankle inversion    Ankle eversion     (Blank rows = not tested)  LOWER EXTREMITY MMT:    MMT Right Eval Left Eval  Hip flexion    Hip extension    Hip abduction    Hip adduction    Hip internal rotation    Hip external rotation    Knee flexion    Knee extension    Ankle dorsiflexion    Ankle plantarflexion    Ankle inversion    Ankle eversion    (Blank rows = not tested)  BED MOBILITY:  {bed mobility:32615:p}  TRANSFERS: {transfers eval:32620}  RAMP:  {ramp eval:32616}  CURB:  {curb eval:32617}  STAIRS: {stairs eval:32618} GAIT: Findings: {GaitneuroPT:32644::Distance walked: ***,Comments: ***}  FUNCTIONAL TESTS:  5 times sit to stand: *** Timed up and go (TUG): *** 6 minute walk test: *** 10 meter walk test: *** Berg Balance Scale:  Item Test date: *** Date:  Date:   Sitting to standing {berg sit to stand:32780} Insert SmartPhrase OPRCBERGREEVAL Insert SmartPhrase OPRCBERGREEVAL  2. Standing unsupported {berg stand unsupported:32781}    3. Sitting with back  unsupported, feet supported {berg sit unsupported:32782}    4. Standing to sitting {berg stand to sit:32783}    5. Pivot transfer  {berg transfers:32784}    6. Standing unsupported with eyes closed {berg stand unsupported EC:32785}    7. Standing unsupported with feet together {berg stand unsupported feet together:32786}    8. Reaching forward with outstretched arms while standing {berg reaching:32787}    9. Pick up object from the floor from standing {berg object from floor:32788}    10. Turning  to look behind over left and right shoulders while standing {bern turn to look:32789}    11. Turn 360 degrees {berg turn 360:32790}    12. Place alternate foot on step or stool while standing unsupported {berg alternating foot:32791}    13. Standing unsupported one foot in front {berg tandem:32792}    14. Standing on one leg {berg SLS:32793}     Total Score ***/56 Total Score:    Total Score:      PATIENT SURVEYS:  ABC scale: The Activities-Specific Balance Confidence (ABC) Scale 0% 10 20 30  40 50 60 70 80 90 100% No confidence<->completely confident  "How confident are you that you will not lose your balance or become unsteady when you . . .   Date tested ***  Walk around the house ***%  2. Walk up or down stairs ***%  3. Bend over and pick up a slipper from in front of a closet floor ***%  4. Reach for a small can off a shelf at eye level ***%  5. Stand on tip toes and reach for something above your head ***%  6. Stand on a chair and reach for something ***%  7. Sweep the floor ***%  8. Walk outside the house to a car parked in the driveway ***%  9. Get into or out of a car ***%  10. Walk across a parking lot to the mall ***%  11. Walk up or down a ramp ***%  12. Walk in a crowded mall where people rapidly walk past you ***%  13. Are bumped into by people as you walk through the mall ***%  14. Step onto or off of an escalator while you are holding onto the railing ***%  15. Step onto or  off an escalator while holding onto parcels such that you cannot hold onto the railing ***%  16. Walk outside on icy sidewalks ***%  Total: #/16 ***                                                                                                                                 TREATMENT DATE: 06/22/2024    PATIENT EDUCATION: Education details: *** Person educated: {Person educated:25204} Education method: {Education Method:25205} Education comprehension: {Education Comprehension:25206}  HOME EXERCISE PROGRAM: ***   GOALS: Goals reviewed with patient? No   SHORT TERM GOALS: Target date: 07/20/2024    Patient will be independent in home exercise program to improve strength/mobility for better functional independence with ADLs. Baseline: Goal status: INITIAL   LONG TERM GOALS: Target date: 09/14/2024    Patient will increase ABC scale score >80% to demonstrate better functional mobility and better confidence with ADLs.  Baseline:  Goal status: INITIAL  2.  Patient (> 22 years old) will complete five times sit to stand test in < 15 seconds indicating an increased LE strength and improved balance. Baseline:  Goal status: INITIAL  3.  Patient will increase  Berg Balance score by > 6 points to demonstrate decreased fall risk during functional activities Baseline:  Goal status: INITIAL  4.  Patient will increase 10 meter walk test to >1.39m/s as to improve gait speed for better community ambulation and to reduce fall risk. Baseline:  Goal status: INITIAL  5.  Patient will reduce timed up and go to <11 seconds to reduce fall risk and demonstrate improved transfer/gait ability. Baseline:  Goal status: INITIAL   ASSESSMENT:  CLINICAL IMPRESSION: Patient is a 89 y.o. female who was seen today for physical therapy evaluation and treatment for history of cerebral infarction/TIA and abnormality of gait and mobility.   OBJECTIVE IMPAIRMENTS: {opptimpairments:25111}.    ACTIVITY LIMITATIONS: {activitylimitations:27494}  PARTICIPATION LIMITATIONS: {participationrestrictions:25113}  PERSONAL FACTORS: Age, Past/current experiences, Time since onset of injury/illness/exacerbation, and 3+ comorbidities: hx of stroke, HTN, arthritis, anemia, chronic renal impairment are also affecting patient's functional outcome.   REHAB POTENTIAL: Good  CLINICAL DECISION MAKING: Evolving/moderate complexity   EVALUATION COMPLEXITY: Moderate  PLAN:  PT FREQUENCY: 2x/week  PT DURATION: 12 weeks  PLANNED INTERVENTIONS: 97164- PT Re-evaluation, 97750- Physical Performance Testing, 97110-Therapeutic exercises, 97530- Therapeutic activity, 97112- Neuromuscular re-education, 97535- Self Care, 02859- Manual therapy, [phone]- Gait training, [phone]- Orthotic Initial, [phone]- Orthotic/Prosthetic subsequent, [phone]- Aquatic Therapy, [phone]- Electrical stimulation (unattended), [phone] (1-2 muscles), 20561 (3+ muscles)- Dry Needling, Patient/Family education, Balance training, Stair training, Joint mobilization, Joint manipulation, Spinal mobilization, Vestibular training, Visual/preceptual remediation/compensation, DME instructions, Wheelchair mobility training, Cryotherapy, and Moist heat  PLAN FOR NEXT SESSION: ***  Renna Helling, SPT 06/22/2024, 8:37 AM

## 2024-06-24 ENCOUNTER — Ambulatory Visit: Admitting: Physical Therapy

## 2024-06-24 NOTE — Progress Notes (Deleted)
 OUTPATIENT PHYSICAL THERAPY NEURO EVALUATION   Patient Name: Betty Atkins MRN: [id number] DOB:1933-06-29, 89 y.o., female Today's Date: 06/24/2024   PCP: Epifanio Alm SQUIBB, MD  REFERRING PROVIDER: Epifanio Alm SQUIBB, MD   END OF SESSION:   Past Medical History:  Diagnosis Date   Anemia    Arthritis    degenerative   Chronic renal impairment, stage 3 (moderate), unspecified whether stage 3a or 3b CKD    Dyspnea    Gout involving toe    Hyperlipidemia    Hypertension    Pre-diabetes    Stroke [facility])    Past Surgical History:  Procedure Laterality Date   ABDOMINAL HYSTERECTOMY     CAROTID PTA/STENT INTERVENTION Right 11/19/2021   Procedure: CAROTID PTA/STENT INTERVENTION;  Surgeon: Marea Selinda RAMAN, MD;  Location: [facility] INVASIVE CV LAB;  Service: Cardiovascular;  Laterality: Right;   CATARACT EXTRACTION W/PHACO Left 02/10/2023   Procedure: CATARACT EXTRACTION PHACO AND INTRAOCULAR LENS PLACEMENT (IOC) LEFT DIABETIC  4.82  00:46.6;  Surgeon: Myrna Adine Anes, MD;  Location: [facility];  Service: Ophthalmology;  Laterality: Left;   CATARACT EXTRACTION W/PHACO Right 02/24/2023   Procedure: CATARACT EXTRACTION PHACO AND INTRAOCULAR LENS PLACEMENT (IOC) RIGHT 5.19 00:46.5;  Surgeon: Myrna Adine Anes, MD;  Location: [facility];  Service: Ophthalmology;  Laterality: Right;   COLONOSCOPY WITH PROPOFOL  N/A 05/02/2015   Procedure: COLONOSCOPY WITH PROPOFOL ;  Surgeon: Gladis RAYMOND Mariner, MD;  Location: [facility] ENDOSCOPY;  Service: Endoscopy;  Laterality: N/A;   JOINT REPLACEMENT     right total hip   Patient Active Problem List   Diagnosis Date Noted   Carotid stenosis, symptomatic, with infarction (HCC) 11/13/2021   Muscle weakness of left upper extremity 10/22/2021   Stage 3a chronic kidney disease (HCC) 12/29/2019   Severe obesity (BMI 35.0-39.9) with comorbidity (HCC) 12/29/2019   Mixed hyperlipidemia 11/17/2017   Type 2 diabetes mellitus without complication,  without long-term current use of insulin  (HCC) 11/17/2017   Prediabetes 11/21/2016   History of colon polyps 01/31/2015   DDD (degenerative disc disease), lumbar 01/27/2015   H/O bilateral hip replacements 01/27/2015   Anemia 04/14/2014   Essential hypertension 04/14/2014   Hyperlipidemia 04/14/2014    ONSET DATE: ***  REFERRING DIAG:  R26.89 (ICD-10-CM) - Other abnormalities of gait and mobility  Z86.73 (ICD-10-CM) - Personal history of transient ischemic attack (TIA), and cerebral infarction without residual deficits    THERAPY DIAG:  No diagnosis found.  Rationale for Evaluation and Treatment: Rehabilitation  SUBJECTIVE:  SUBJECTIVE STATEMENT: *** Pt accompanied by: {accompnied:27141}  PERTINENT HISTORY:  From Progress note with PCP 05/25/2024: IMPRESSION:  MRI brain:   1. Two subcentimeter acute infarcts within the mid-to-posterior  right frontal lobe, within the right ACA territory or ACA/MCA  watershed territory.  2. Additional subcentimeter acute infarct within the right parietal  lobe, in the right MCA/PCA watershed territory.  3. Background moderate chronic small vessel ischemic changes within  the cerebral white matter and pons, slightly progressed from the  prior brain MRI of 12/15/2006.   MRA head:   1. No intracranial large vessel occlusion or proximal high-grade  arterial stenosis.  2. Mild/moderate atherosclerotic narrowing of the non-dominant V4  right vertebral artery, distal to the PICA origin.  3. Left PCA distal branch atherosclerotic irregularity.   MRA neck:   1. The common carotid, internal carotid and vertebral arteries are  patent within the neck.  2. Severe atherosclerotic stenosis of the proximal right internal  carotid artery (greater than 80%).  3.  Apparent moderate/severe stenosis within the proximal V1 right  vertebral artery.    PAIN:  Are you having pain? {OPRCPAIN:27236}  PRECAUTIONS: {Therapy precautions:24002}  RED FLAGS: {PT Red Flags:29287}   WEIGHT BEARING RESTRICTIONS: {Yes ***/No:24003}  FALLS: Has patient fallen in last 6 months? {fallsyesno:27318}  LIVING ENVIRONMENT: Lives with: {OPRC lives with:25569::lives with their family} Lives in: {Lives in:25570} Stairs: {opstairs:27293} Has following equipment at home: {Assistive devices:23999}  PLOF: {PLOF:24004}  PATIENT GOALS: ***  OBJECTIVE:  Note: Objective measures were completed at Evaluation unless otherwise noted.  DIAGNOSTIC FINDINGS:  From brain MRI 10/22/2021: IMPRESSION: MRI brain:   1. Two subcentimeter acute infarcts within the mid-to-posterior right frontal lobe, within the right ACA territory or ACA/MCA watershed territory. 2. Additional subcentimeter acute infarct within the right parietal lobe, in the right MCA/PCA watershed territory. 3. Background moderate chronic small vessel ischemic changes within the cerebral white matter and pons, slightly progressed from the prior brain MRI of 12/15/2006.  COGNITION: Overall cognitive status: {cognition:24006}   SENSATION: {sensation:27233}  COORDINATION: ***  EDEMA:  {edema:24020}   POSTURE: {posture:25561}  LOWER EXTREMITY ROM:     {AROM/PROM:27142}  Right Eval Left Eval  Hip flexion    Hip extension    Hip abduction    Hip adduction    Hip internal rotation    Hip external rotation    Knee flexion    Knee extension    Ankle dorsiflexion    Ankle plantarflexion    Ankle inversion    Ankle eversion     (Blank rows = not tested)  LOWER EXTREMITY MMT:    MMT Right Eval Left Eval  Hip flexion    Hip extension    Hip abduction    Hip adduction    Hip internal rotation    Hip external rotation    Knee flexion    Knee extension    Ankle dorsiflexion     Ankle plantarflexion    Ankle inversion    Ankle eversion    (Blank rows = not tested)  BED MOBILITY:  {bed mobility:32615:p}  TRANSFERS: {transfers eval:32620}  RAMP:  {ramp eval:32616}  CURB:  {curb eval:32617}  STAIRS: {stairs eval:32618} GAIT: Findings: {GaitneuroPT:32644::Distance walked: ***,Comments: ***}  FUNCTIONAL TESTS:  5 times sit to stand: *** Timed up and go (TUG): *** 6 minute walk test: *** 10 meter walk test: *** Berg Balance Scale:  Item Test date: *** Date:  Date:   Sitting to standing {berg sit to stand:32780}  Insert SmartPhrase OPRCBERGREEVAL Insert SmartPhrase OPRCBERGREEVAL  2. Standing unsupported {berg stand unsupported:32781}    3. Sitting with back unsupported, feet supported {berg sit unsupported:32782}    4. Standing to sitting {berg stand to sit:32783}    5. Pivot transfer  {berg transfers:32784}    6. Standing unsupported with eyes closed {berg stand unsupported EC:32785}    7. Standing unsupported with feet together {berg stand unsupported feet together:32786}    8. Reaching forward with outstretched arms while standing {berg reaching:32787}    9. Pick up object from the floor from standing {berg object from floor:32788}    10. Turning to look behind over left and right shoulders while standing {bern turn to look:32789}    11. Turn 360 degrees {berg turn 360:32790}    12. Place alternate foot on step or stool while standing unsupported {berg alternating foot:32791}    13. Standing unsupported one foot in front {berg tandem:32792}    14. Standing on one leg {berg SLS:32793}     Total Score ***/56 Total Score:    Total Score:      PATIENT SURVEYS:  ABC scale: The Activities-Specific Balance Confidence (ABC) Scale 0% 10 20 30  40 50 60 70 80 90 100% No confidence<->completely confident  "How confident are you that you will not lose your balance or become unsteady when you . . .   Date tested ***  Walk around the house ***%  2.  Walk up or down stairs ***%  3. Bend over and pick up a slipper from in front of a closet floor ***%  4. Reach for a small can off a shelf at eye level ***%  5. Stand on tip toes and reach for something above your head ***%  6. Stand on a chair and reach for something ***%  7. Sweep the floor ***%  8. Walk outside the house to a car parked in the driveway ***%  9. Get into or out of a car ***%  10. Walk across a parking lot to the mall ***%  11. Walk up or down a ramp ***%  12. Walk in a crowded mall where people rapidly walk past you ***%  13. Are bumped into by people as you walk through the mall ***%  14. Step onto or off of an escalator while you are holding onto the railing ***%  15. Step onto or off an escalator while holding onto parcels such that you cannot hold onto the railing ***%  16. Walk outside on icy sidewalks ***%  Total: #/16 ***                                                                                                                                 TREATMENT DATE: 06/24/2024     PATIENT EDUCATION: Education details: *** Person educated: {Person educated:25204} Education method: {Education Method:25205} Education comprehension: {Education Comprehension:25206}  HOME EXERCISE PROGRAM: ***  GOALS: Goals reviewed with  patient? No   SHORT TERM GOALS: Target date: 07/22/2024    Patient will be independent in home exercise program to improve strength/mobility for better functional independence with ADLs. Baseline: Goal status: INITIAL   LONG TERM GOALS: Target date: 09/16/2024    Patient will increase ABC scale score >80% to demonstrate better functional mobility and better confidence with ADLs.  Baseline:  Goal status: INITIAL  2.  Patient (> 21 years old) will complete five times sit to stand test in < 15 seconds indicating an increased LE strength and improved balance. Baseline:  Goal status: INITIAL  3.  Patient will increase Berg Balance score  by > 6 points to demonstrate decreased fall risk during functional activities Baseline:  Goal status: INITIAL  4.  Patient will increase 10 meter walk test to >1.22m/s as to improve gait speed for better community ambulation and to reduce fall risk. Baseline:  Goal status: INITIAL  5.  Patient will reduce timed up and go to <11 seconds to reduce fall risk and demonstrate improved transfer/gait ability. Baseline:  Goal status: INITIAL   ASSESSMENT:  CLINICAL IMPRESSION: Patient is a 89 y.o. female who was seen today for physical therapy evaluation and treatment for abnormality of gait and mobility, and history of TIA.   OBJECTIVE IMPAIRMENTS: {opptimpairments:25111}.   ACTIVITY LIMITATIONS: {activitylimitations:27494}  PARTICIPATION LIMITATIONS: {participationrestrictions:25113}  PERSONAL FACTORS: Age, Past/current experiences, Time since onset of injury/illness/exacerbation, and 3+ comorbidities: chronic kidney disease,symptomatic carotid stenosis, anemia, essential HTN, hyperlipidemia are also affecting patient's functional outcome.   REHAB POTENTIAL: Good  CLINICAL DECISION MAKING: Evolving/moderate complexity  EVALUATION COMPLEXITY: Moderate  PLAN:  PT FREQUENCY: 2x/week  PT DURATION: 12 weeks  PLANNED INTERVENTIONS: 97164- PT Re-evaluation, 97750- Physical Performance Testing, 97110-Therapeutic exercises, 97530- Therapeutic activity, 97112- Neuromuscular re-education, 97535- Self Care, 02859- Manual therapy, [phone]- Gait training, [phone]- Orthotic Initial, [phone]- Orthotic/Prosthetic subsequent, [phone]- Canalith repositioning, [id number]- Aquatic Therapy, [phone]- Electrical stimulation (unattended), [phone] (1-2 muscles), 20561 (3+ muscles)- Dry Needling, Patient/Family education, Balance training, Stair training, Taping, Joint mobilization, Joint manipulation, Spinal mobilization, Compression bandaging, Vestibular training, Cognitive remediation, DME instructions, Wheelchair mobility  training, Cryotherapy, and Moist heat  PLAN FOR NEXT SESSION: ***   Renna Helling, SPT 06/24/2024, 8:56 AM

## 2024-06-29 ENCOUNTER — Ambulatory Visit: Admitting: Physical Therapy

## 2024-07-01 ENCOUNTER — Ambulatory Visit: Admitting: Physical Therapy

## 2024-07-06 ENCOUNTER — Ambulatory Visit: Admitting: Physical Therapy

## 2024-07-07 ENCOUNTER — Ambulatory Visit: Admitting: Physical Therapy

## 2024-07-08 ENCOUNTER — Ambulatory Visit: Admitting: Physical Therapy

## 2024-07-13 ENCOUNTER — Ambulatory Visit: Admitting: Physical Therapy

## 2024-07-20 ENCOUNTER — Ambulatory Visit: Admitting: Physical Therapy

## 2024-07-27 ENCOUNTER — Ambulatory Visit: Admitting: Physical Therapy

## 2024-07-29 ENCOUNTER — Ambulatory Visit: Admitting: Physical Therapy

## 2025-05-13 ENCOUNTER — Ambulatory Visit (INDEPENDENT_AMBULATORY_CARE_PROVIDER_SITE_OTHER): Payer: PRIVATE HEALTH INSURANCE | Admitting: Nurse Practitioner

## 2025-05-13 ENCOUNTER — Encounter (INDEPENDENT_AMBULATORY_CARE_PROVIDER_SITE_OTHER)
